# Patient Record
Sex: Female | Born: 1960 | Race: White | Hispanic: No | Marital: Married | Smoking: Never smoker
Health system: Southern US, Community
[De-identification: ages and names within clinical notes are randomized; demographics above are authoritative.]

## PROBLEM LIST (undated history)

## (undated) DIAGNOSIS — E039 Hypothyroidism, unspecified: Secondary | ICD-10-CM

## (undated) DIAGNOSIS — I10 Essential (primary) hypertension: Secondary | ICD-10-CM

## (undated) DIAGNOSIS — N3010 Interstitial cystitis (chronic) without hematuria: Secondary | ICD-10-CM

## (undated) DIAGNOSIS — E785 Hyperlipidemia, unspecified: Secondary | ICD-10-CM

## (undated) HISTORY — PX: ABLATION: SHX5711

## (undated) HISTORY — PX: CYSTOSCOPY: SUR368

## (undated) HISTORY — PX: PLANTAR FASCIA RELEASE: SHX2239

---

## 2004-04-20 ENCOUNTER — Ambulatory Visit: Payer: Self-pay | Admitting: Unknown Physician Specialty

## 2004-04-22 ENCOUNTER — Ambulatory Visit: Payer: Self-pay | Admitting: Unknown Physician Specialty

## 2005-06-09 ENCOUNTER — Ambulatory Visit: Payer: Self-pay | Admitting: Unknown Physician Specialty

## 2005-06-29 ENCOUNTER — Ambulatory Visit: Payer: Self-pay | Admitting: Unknown Physician Specialty

## 2007-05-16 ENCOUNTER — Ambulatory Visit: Payer: Self-pay | Admitting: Unknown Physician Specialty

## 2008-07-09 ENCOUNTER — Ambulatory Visit: Payer: Self-pay | Admitting: Unknown Physician Specialty

## 2008-07-14 ENCOUNTER — Ambulatory Visit: Payer: Self-pay | Admitting: Unknown Physician Specialty

## 2008-07-15 ENCOUNTER — Ambulatory Visit: Payer: Self-pay | Admitting: Unknown Physician Specialty

## 2009-04-15 ENCOUNTER — Emergency Department: Payer: Self-pay | Admitting: Emergency Medicine

## 2009-04-22 ENCOUNTER — Ambulatory Visit: Payer: Self-pay | Admitting: Internal Medicine

## 2009-07-15 ENCOUNTER — Ambulatory Visit: Payer: Self-pay | Admitting: Unknown Physician Specialty

## 2009-07-16 ENCOUNTER — Ambulatory Visit: Payer: Self-pay | Admitting: Unknown Physician Specialty

## 2009-11-10 ENCOUNTER — Ambulatory Visit: Payer: Self-pay | Admitting: Family Medicine

## 2010-04-14 ENCOUNTER — Other Ambulatory Visit: Payer: Self-pay | Admitting: Internal Medicine

## 2010-07-19 ENCOUNTER — Ambulatory Visit: Payer: Self-pay

## 2011-08-10 ENCOUNTER — Ambulatory Visit: Payer: Self-pay | Admitting: Internal Medicine

## 2012-08-15 ENCOUNTER — Ambulatory Visit: Payer: Self-pay | Admitting: Internal Medicine

## 2013-06-03 ENCOUNTER — Ambulatory Visit: Payer: Self-pay | Admitting: Obstetrics and Gynecology

## 2013-06-03 LAB — CBC
HCT: 40.9 % (ref 35.0–47.0)
HGB: 13.8 g/dL (ref 12.0–16.0)
MCH: 30.9 pg (ref 26.0–34.0)
MCHC: 33.8 g/dL (ref 32.0–36.0)
MCV: 91 fL (ref 80–100)
Platelet: 272 10*3/uL (ref 150–440)
RBC: 4.48 10*6/uL (ref 3.80–5.20)
RDW: 13.3 % (ref 11.5–14.5)
WBC: 7.9 10*3/uL (ref 3.6–11.0)

## 2013-06-09 ENCOUNTER — Ambulatory Visit: Payer: Self-pay | Admitting: Obstetrics and Gynecology

## 2013-06-11 LAB — PATHOLOGY REPORT

## 2013-08-21 ENCOUNTER — Ambulatory Visit: Payer: Self-pay | Admitting: Internal Medicine

## 2014-05-16 NOTE — Op Note (Signed)
PATIENT NAME:  Sara Marks, Sara Marks MR#:  [id number] DATE OF BIRTH:  1960/12/31  DATE OF PROCEDURE:  06/09/2013  PREOPERATIVE DIAGNOSIS:  Menorrhagia.   POSTOPERATIVE DIAGNOSES: 1.  Menorrhagia.  2.  Endometrial polyp.   OPERATIVE PROCEDURES: 1.  Hysteroscopy with dilation and curettage of the endometrium.  2.  NovaSure endometrial ablation.   SURGEON: Brayton Mars, M.D.   FIRST ASSISTANT: None.   ANESTHESIA: General.   INDICATIONS: The patient is a 54 year old  white female, para 1-0-0-1, with a long history of menorrhagia, who presents for definitive surgical management of abnormal uterine bleeding. Previous endometrial biopsy in October 2014 was benign. Pelvic ultrasound demonstrated normal endometrial stripe measuring 5.4 mm.   FINDINGS AT SURGERY: Revealed a uterus which was midplane to retroverted; uterine sounding was to 9 cm. Cervical length was 4 cm. The cavity width was 3.8 cm. There was a single 1 cm endometrial polyp identified within the endometrial cavity.   DESCRIPTION OF PROCEDURE: The patient was brought to the Operating Room where she was placed in the supine position. General anesthesia was induced without difficulty. She was placed in the dorsal lithotomy position using the bumblebee stirrups. A Betadine perineal and intravaginal prep and drape was performed in standard fashion. A latex-free catheter was used to drain 30 mL of urine from the bladder.   A weighted speculum was placed into the vagina, and a single-tooth tenaculum was placed on the anterior lip of the cervix. The uterus sounded to 9 cm. Hanks dilators were used up to a #8-French caliber in order to dilate the endocervical canal. The [facility] hysteroscope using lactated Ringer's as irrigant was used to assess the intrauterine cavity. Photo documentation was performed. Stone polyp forceps were used to remove the polyp. Curettage was then performed with both smooth and serrated curettes with production of a small  amount of tissue. This was sent to pathology. Repeat hysteroscopy demonstrated minimal residual tissue left behind. Next,  NovaSure ablation was performed in routine fashion. The NovaSure instrument was placed and deployed in standard fashion. After setting the instrument, the cavity width was noted to be 3.8 cm. Cavity test was performed which was successful. This was followed by the ablation procedure which took 59 seconds.   Following completion of the procedure, the instrument was removed. Repeat hysteroscopy was performed and demonstrated an excellent char effect on the uterine cavity. The procedure was then terminated with all instrumentation being removed from the vagina. The patient was then awakened, mobilized and taken to the recovery room in satisfactory condition.   ESTIMATED BLOOD LOSS: Minimal.   COMPLICATIONS: None.   All instruments, needle and sponge counts were verified as correct.   ____________________________ Alanda Slim. Ademide Schaberg, MD mad:cs D: 06/09/2013 14:18:36 ET T: 06/09/2013 14:51:49 ET JOB#: [id number]  cc: Hassell Done A. Delainee Tramel, MD, <Dictator> [facility] MD ELECTRONICALLY SIGNED 06/11/2013 8:11

## 2015-04-01 ENCOUNTER — Ambulatory Visit: Payer: Self-pay | Admitting: Physician Assistant

## 2015-04-01 ENCOUNTER — Encounter: Payer: Self-pay | Admitting: Physician Assistant

## 2015-04-01 VITALS — BP 140/90 | HR 76 | Temp 99.2°F

## 2015-04-01 DIAGNOSIS — R05 Cough: Secondary | ICD-10-CM

## 2015-04-01 DIAGNOSIS — R059 Cough, unspecified: Secondary | ICD-10-CM

## 2015-04-01 LAB — POCT INFLUENZA A/B
Influenza A, POC: NEGATIVE
Influenza B, POC: NEGATIVE

## 2015-04-01 MED ORDER — AZITHROMYCIN 250 MG PO TABS: ORAL_TABLET | ORAL | Status: DC

## 2015-04-01 MED ORDER — BENZONATATE 200 MG PO CAPS: 200.0000 mg | ORAL_CAPSULE | Freq: Three times a day (TID) | ORAL | Status: DC | PRN

## 2015-04-01 MED ORDER — OMEPRAZOLE 20 MG PO CPDR: 20.0000 mg | DELAYED_RELEASE_CAPSULE | Freq: Every day | ORAL | Status: DC

## 2015-04-01 NOTE — Progress Notes (Signed)
S: C/o runny nose and congestion for 3 days, + fever, chills, body aches this am, denies cp/sob, v/d; mucus was green this am but clear throughout the day, cough is sporadic, also ?if could have rx for prilosec, is paying otc prices  Using otc meds: robitussin  O: PE: vitals wnl, nad, perrl eomi, normocephalic, tms dull, nasal mucosa red and swollen, throat injected, neck supple no lymph, lungs c t a, cv rrr, neuro intact, flu swab  A:  Acute uri   P: tessalon, zpack, omeprazole 20mg  , drink fluids, continue regular meds , use otc meds of choice, return if not improving in 5 days, return earlier if worsening

## 2015-04-05 ENCOUNTER — Telehealth: Payer: Self-pay | Admitting: Physician Assistant

## 2015-04-05 MED ORDER — METHYLPREDNISOLONE 4 MG PO TBPK: ORAL_TABLET | ORAL | Status: DC

## 2015-04-05 NOTE — Progress Notes (Signed)
Pt called and is still coughing, ?if can have steroid Steroid sent to [facility]

## 2015-04-05 NOTE — Addendum Note (Signed)
Addended by: Versie Starks on: 04/05/2015 04:31 PM   Modules accepted: Orders

## 2015-05-12 DIAGNOSIS — E039 Hypothyroidism, unspecified: Secondary | ICD-10-CM | POA: Diagnosis not present

## 2015-05-12 DIAGNOSIS — Z0000 Encounter for general adult medical examination without abnormal findings: Secondary | ICD-10-CM | POA: Diagnosis not present

## 2015-05-13 DIAGNOSIS — Z0000 Encounter for general adult medical examination without abnormal findings: Secondary | ICD-10-CM | POA: Diagnosis not present

## 2015-05-13 DIAGNOSIS — R069 Unspecified abnormalities of breathing: Secondary | ICD-10-CM | POA: Diagnosis not present

## 2015-05-13 DIAGNOSIS — R0609 Other forms of dyspnea: Secondary | ICD-10-CM | POA: Diagnosis not present

## 2015-06-16 ENCOUNTER — Other Ambulatory Visit: Payer: Self-pay | Admitting: Obstetrics and Gynecology

## 2015-06-16 DIAGNOSIS — Z1231 Encounter for screening mammogram for malignant neoplasm of breast: Secondary | ICD-10-CM

## 2015-07-08 ENCOUNTER — Other Ambulatory Visit: Payer: Self-pay | Admitting: Obstetrics and Gynecology

## 2015-07-08 ENCOUNTER — Ambulatory Visit
Admission: RE | Admit: 2015-07-08 | Discharge: 2015-07-08 | Disposition: A | Discharge status: Home / Self Care | Payer: 59 | Source: Ambulatory Visit | Attending: Obstetrics and Gynecology | Admitting: Obstetrics and Gynecology

## 2015-07-08 DIAGNOSIS — Z1231 Encounter for screening mammogram for malignant neoplasm of breast: Secondary | ICD-10-CM

## 2015-07-15 ENCOUNTER — Other Ambulatory Visit: Payer: Self-pay | Admitting: Obstetrics and Gynecology

## 2015-07-15 DIAGNOSIS — N6459 Other signs and symptoms in breast: Secondary | ICD-10-CM

## 2015-07-16 DIAGNOSIS — H5213 Myopia, bilateral: Secondary | ICD-10-CM | POA: Diagnosis not present

## 2015-07-22 ENCOUNTER — Ambulatory Visit
Admission: RE | Admit: 2015-07-22 | Discharge: 2015-07-22 | Disposition: A | Discharge status: Home / Self Care | Payer: 59 | Source: Ambulatory Visit | Attending: Obstetrics and Gynecology | Admitting: Obstetrics and Gynecology

## 2015-07-22 DIAGNOSIS — R928 Other abnormal and inconclusive findings on diagnostic imaging of breast: Secondary | ICD-10-CM | POA: Insufficient documentation

## 2015-07-22 DIAGNOSIS — N63 Unspecified lump in breast: Secondary | ICD-10-CM | POA: Insufficient documentation

## 2015-07-22 DIAGNOSIS — N6489 Other specified disorders of breast: Secondary | ICD-10-CM | POA: Diagnosis not present

## 2015-07-22 DIAGNOSIS — R6889 Other general symptoms and signs: Secondary | ICD-10-CM | POA: Diagnosis not present

## 2015-07-22 DIAGNOSIS — N6459 Other signs and symptoms in breast: Secondary | ICD-10-CM

## 2015-07-28 ENCOUNTER — Other Ambulatory Visit: Payer: Self-pay | Admitting: Obstetrics and Gynecology

## 2015-07-28 DIAGNOSIS — N6320 Unspecified lump in the left breast, unspecified quadrant: Secondary | ICD-10-CM

## 2015-12-30 ENCOUNTER — Ambulatory Visit: Payer: Self-pay | Admitting: Physician Assistant

## 2015-12-30 VITALS — BP 122/90 | HR 80 | Temp 99.2°F

## 2015-12-30 DIAGNOSIS — J0100 Acute maxillary sinusitis, unspecified: Secondary | ICD-10-CM

## 2015-12-30 MED ORDER — PREDNISONE 10 MG PO TABS
30.0000 mg | ORAL_TABLET | Freq: Every day | ORAL | 0 refills | Status: DC
Start: 2015-12-30 — End: 2016-12-08

## 2015-12-30 MED ORDER — FLUCONAZOLE 150 MG PO TABS: 150.0000 mg | ORAL_TABLET | Freq: Once | ORAL | 0 refills | Status: AC

## 2015-12-30 MED ORDER — AMOXICILLIN-POT CLAVULANATE 875-125 MG PO TABS: 1.0000 | ORAL_TABLET | Freq: Two times a day (BID) | ORAL | 0 refills | Status: DC

## 2015-12-30 NOTE — Progress Notes (Signed)
S: c/o r sided facial swelling, states she thinks her sinus is infected, some teeth pressure but no pain, awoke with worse swelling this am, no fever, unable to get any mucus out  O: vitals wnl, nad, tms dull, nasal mucosa red/swollen, r side face at maxillary sinus swollen tender, swelling extends to bridge of nose, no redness of skin, throat wnl, neck supple no lymph, lungs c t a, cv rrr  A: acute sinusitis with possible abscess  P: augmentin 875 bid x 10d, pred 30mg  qd x 3d, diflucan if needed

## 2016-01-20 ENCOUNTER — Ambulatory Visit
Admission: RE | Admit: 2016-01-20 | Discharge: 2016-01-20 | Disposition: A | Discharge status: Home / Self Care | Payer: 59 | Source: Ambulatory Visit | Attending: Obstetrics and Gynecology | Admitting: Obstetrics and Gynecology

## 2016-01-20 DIAGNOSIS — N6320 Unspecified lump in the left breast, unspecified quadrant: Secondary | ICD-10-CM

## 2016-01-20 DIAGNOSIS — N6489 Other specified disorders of breast: Secondary | ICD-10-CM | POA: Insufficient documentation

## 2016-01-28 ENCOUNTER — Other Ambulatory Visit: Payer: Self-pay | Admitting: Obstetrics and Gynecology

## 2016-01-28 DIAGNOSIS — N6320 Unspecified lump in the left breast, unspecified quadrant: Secondary | ICD-10-CM

## 2016-05-08 DIAGNOSIS — Z0000 Encounter for general adult medical examination without abnormal findings: Secondary | ICD-10-CM | POA: Diagnosis not present

## 2016-05-15 DIAGNOSIS — Z0000 Encounter for general adult medical examination without abnormal findings: Secondary | ICD-10-CM | POA: Diagnosis not present

## 2016-05-15 DIAGNOSIS — R7989 Other specified abnormal findings of blood chemistry: Secondary | ICD-10-CM | POA: Diagnosis not present

## 2016-05-15 DIAGNOSIS — M7062 Trochanteric bursitis, left hip: Secondary | ICD-10-CM | POA: Diagnosis not present

## 2016-07-10 ENCOUNTER — Ambulatory Visit
Admission: RE | Admit: 2016-07-10 | Discharge: 2016-07-10 | Disposition: A | Discharge status: Home / Self Care | Payer: 59 | Source: Ambulatory Visit | Attending: Obstetrics and Gynecology | Admitting: Obstetrics and Gynecology

## 2016-07-10 DIAGNOSIS — N6320 Unspecified lump in the left breast, unspecified quadrant: Secondary | ICD-10-CM | POA: Diagnosis present

## 2016-07-10 DIAGNOSIS — N6001 Solitary cyst of right breast: Secondary | ICD-10-CM | POA: Diagnosis not present

## 2016-07-10 DIAGNOSIS — N6002 Solitary cyst of left breast: Secondary | ICD-10-CM | POA: Diagnosis not present

## 2016-07-10 DIAGNOSIS — R928 Other abnormal and inconclusive findings on diagnostic imaging of breast: Secondary | ICD-10-CM | POA: Diagnosis not present

## 2016-07-19 DIAGNOSIS — Z1211 Encounter for screening for malignant neoplasm of colon: Secondary | ICD-10-CM | POA: Diagnosis not present

## 2016-12-07 ENCOUNTER — Encounter: Payer: Self-pay | Admitting: *Deleted

## 2016-12-08 ENCOUNTER — Encounter
Admission: RE | Disposition: A | Discharge status: Home / Self Care | Payer: Self-pay | Source: Ambulatory Visit | Attending: Gastroenterology

## 2016-12-08 ENCOUNTER — Encounter: Payer: Self-pay | Admitting: *Deleted

## 2016-12-08 ENCOUNTER — Ambulatory Visit: Payer: 59 | Admitting: Anesthesiology

## 2016-12-08 ENCOUNTER — Ambulatory Visit
Admission: RE | Admit: 2016-12-08 | Discharge: 2016-12-08 | Disposition: A | Discharge status: Home / Self Care | Payer: 59 | Source: Ambulatory Visit | Attending: Gastroenterology | Admitting: Gastroenterology

## 2016-12-08 DIAGNOSIS — Z888 Allergy status to other drugs, medicaments and biological substances status: Secondary | ICD-10-CM | POA: Insufficient documentation

## 2016-12-08 DIAGNOSIS — Z1211 Encounter for screening for malignant neoplasm of colon: Secondary | ICD-10-CM | POA: Insufficient documentation

## 2016-12-08 DIAGNOSIS — Z91040 Latex allergy status: Secondary | ICD-10-CM | POA: Diagnosis not present

## 2016-12-08 DIAGNOSIS — E039 Hypothyroidism, unspecified: Secondary | ICD-10-CM | POA: Insufficient documentation

## 2016-12-08 DIAGNOSIS — D125 Benign neoplasm of sigmoid colon: Secondary | ICD-10-CM | POA: Insufficient documentation

## 2016-12-08 DIAGNOSIS — Z79899 Other long term (current) drug therapy: Secondary | ICD-10-CM | POA: Insufficient documentation

## 2016-12-08 DIAGNOSIS — D124 Benign neoplasm of descending colon: Secondary | ICD-10-CM | POA: Insufficient documentation

## 2016-12-08 DIAGNOSIS — D128 Benign neoplasm of rectum: Secondary | ICD-10-CM | POA: Diagnosis not present

## 2016-12-08 DIAGNOSIS — K621 Rectal polyp: Secondary | ICD-10-CM | POA: Diagnosis not present

## 2016-12-08 DIAGNOSIS — I10 Essential (primary) hypertension: Secondary | ICD-10-CM | POA: Insufficient documentation

## 2016-12-08 DIAGNOSIS — Z882 Allergy status to sulfonamides status: Secondary | ICD-10-CM | POA: Insufficient documentation

## 2016-12-08 DIAGNOSIS — K5730 Diverticulosis of large intestine without perforation or abscess without bleeding: Secondary | ICD-10-CM | POA: Diagnosis not present

## 2016-12-08 DIAGNOSIS — D120 Benign neoplasm of cecum: Secondary | ICD-10-CM | POA: Diagnosis not present

## 2016-12-08 DIAGNOSIS — K5790 Diverticulosis of intestine, part unspecified, without perforation or abscess without bleeding: Secondary | ICD-10-CM | POA: Diagnosis not present

## 2016-12-08 DIAGNOSIS — K635 Polyp of colon: Secondary | ICD-10-CM | POA: Diagnosis not present

## 2016-12-08 HISTORY — DX: Essential (primary) hypertension: I10

## 2016-12-08 HISTORY — DX: Hypothyroidism, unspecified: E03.9

## 2016-12-08 HISTORY — PX: COLONOSCOPY WITH PROPOFOL: SHX5780

## 2016-12-08 SURGERY — COLONOSCOPY WITH PROPOFOL
Anesthesia: General

## 2016-12-08 MED ORDER — LIDOCAINE HCL (PF) 2 % IJ SOLN
INTRAMUSCULAR | Status: AC
  Filled 2016-12-08: qty 10

## 2016-12-08 MED ORDER — LIDOCAINE HCL (CARDIAC) 20 MG/ML IV SOLN
INTRAVENOUS | Status: DC | PRN
  Administered 2016-12-08: 40 mg via INTRAVENOUS

## 2016-12-08 MED ORDER — SODIUM CHLORIDE 0.9 % IV SOLN
INTRAVENOUS | Status: DC
  Administered 2016-12-08: 09:00:00 via INTRAVENOUS

## 2016-12-08 MED ORDER — GLYCOPYRROLATE 0.2 MG/ML IJ SOLN
INTRAMUSCULAR | Status: AC
  Filled 2016-12-08: qty 1

## 2016-12-08 MED ORDER — GLYCOPYRROLATE 0.2 MG/ML IJ SOLN
INTRAMUSCULAR | Status: DC | PRN
  Administered 2016-12-08: 0.2 mg via INTRAVENOUS

## 2016-12-08 MED ORDER — SODIUM CHLORIDE 0.9 % IV SOLN: INTRAVENOUS | Status: DC

## 2016-12-08 MED ORDER — PROPOFOL 500 MG/50ML IV EMUL
INTRAVENOUS | Status: AC
  Filled 2016-12-08: qty 50

## 2016-12-08 MED ORDER — PROPOFOL 500 MG/50ML IV EMUL
INTRAVENOUS | Status: DC | PRN
  Administered 2016-12-08: 140 ug/kg/min via INTRAVENOUS

## 2016-12-08 MED ORDER — PROPOFOL 10 MG/ML IV BOLUS
INTRAVENOUS | Status: DC | PRN
  Administered 2016-12-08: 80 mg via INTRAVENOUS

## 2016-12-08 MED ORDER — PHENYLEPHRINE HCL 10 MG/ML IJ SOLN
INTRAMUSCULAR | Status: DC | PRN
  Administered 2016-12-08: 100 ug via INTRAVENOUS

## 2016-12-08 NOTE — Anesthesia Post-op Follow-up Note (Signed)
Anesthesia QCDR form completed.        

## 2016-12-08 NOTE — Transfer of Care (Signed)
Immediate Anesthesia Transfer of Care Note  Patient: Sara Marks  Procedure(s) Performed: COLONOSCOPY WITH PROPOFOL (N/A )  Patient Location: PACU  Anesthesia Type:MAC  Level of Consciousness: awake, drowsy and patient cooperative  Airway & Oxygen Therapy: Patient Spontanous Breathing and Patient connected to nasal cannula oxygen  Post-op Assessment: Report given to RN and Post -op Vital signs reviewed and stable  Post vital signs: Reviewed and stable  Last Vitals:  Vitals:   12/08/16 0808  BP: 121/63  Pulse: 68  Resp: 14  Temp: 37.1 C  SpO2: 100%    Last Pain:  Vitals:   12/08/16 0808  TempSrc: Tympanic         Complications: No apparent anesthesia complications

## 2016-12-08 NOTE — Anesthesia Postprocedure Evaluation (Signed)
Anesthesia Post Note  Patient: Sri N Cossin  Procedure(s) Performed: COLONOSCOPY WITH PROPOFOL (N/A )  Patient location during evaluation: Endoscopy Anesthesia Type: General Level of consciousness: awake and alert Pain management: pain level controlled Vital Signs Assessment: post-procedure vital signs reviewed and stable Respiratory status: spontaneous breathing, nonlabored ventilation, respiratory function stable and patient connected to nasal cannula oxygen Cardiovascular status: blood pressure returned to baseline and stable Postop Assessment: no apparent nausea or vomiting Anesthetic complications: no     Last Vitals:  Vitals:   12/08/16 0808 12/08/16 1050  BP: 121/63 (!) 90/42  Pulse: 68 60  Resp: 14 19  Temp: 37.1 C 36.6 C  SpO2: 100% 100%    Last Pain:  Vitals:   12/08/16 1050  TempSrc: Tympanic                 Precious Haws Kisa Fujii

## 2016-12-08 NOTE — H&P (Signed)
Outpatient short stay form Pre-procedure 12/08/2016 10:05 AM Lollie Sails MD  Primary Physician: Dr. Emily Filbert  Reason for visit:  Colonoscopy  History of present illness:  Patient is a 56 year old female presenting today for her initial colon cancer screening. Tolerated her prep well. She takes no aspirin or blood thinning agents.    Current Facility-Administered Medications:  .  0.9 %  sodium chloride infusion, , Intravenous, Continuous, Lollie Sails, MD, Last Rate: 20 mL/hr at 12/08/16 0830 .  0.9 %  sodium chloride infusion, , Intravenous, Continuous, Lollie Sails, MD  Medications Prior to Admission  Medication Sig Dispense Refill Last Dose  . hydrochlorothiazide (HYDRODIURIL) 25 MG tablet Take by mouth.   12/08/2016 at 0600  . levothyroxine (SYNTHROID, LEVOTHROID) 25 MCG tablet Take by mouth.   12/07/2016 at Unknown time  . lisinopril-hydrochlorothiazide (PRINZIDE,ZESTORETIC) 20-12.5 MG tablet   3 12/08/2016 at 0600  . metoprolol succinate (TOPROL-XL) 50 MG 24 hr tablet Take by mouth.   12/08/2016 at 0600  . EPINEPHrine (EPIPEN 2-PAK) 0.3 mg/0.3 mL IJ SOAJ injection INJECT 0.3 MLS INTO THE MUSCLE ONCE AS NEEDED FOR ANAPHYLAXIS.   Taking  . LORazepam (ATIVAN) 0.5 MG tablet Take by mouth.   Taking  . montelukast (SINGULAIR) 10 MG tablet   3 Taking     Allergies  Allergen Reactions  . Latex Anaphylaxis  . Other Anaphylaxis    BANANAS  . Sulfa Antibiotics Rash  . Floxin [Ofloxacin] Rash     Past Medical History:  Diagnosis Date  . Hypertension   . Hypothyroidism     Review of systems:      Physical Exam    Heart and lungs: Regular rate and rhythm without rub or gallop, lungs are bilaterally clear.    HEENT: Normocephalic atraumatic eyes are anicteric    Other:     Pertinant exam for procedure: Soft nontender nondistended bowel sounds positive normoactive    Planned proceedures: Colonoscopy and indicated procedures. I have discussed the  risks benefits and complications of procedures to include not limited to bleeding, infection, perforation and the risk of sedation and the patient wishes to proceed.     Lollie Sails, MD Gastroenterology 12/08/2016  10:05 AM

## 2016-12-08 NOTE — Op Note (Signed)
[facility] Gastroenterology Patient Name: Sara Marks Procedure Date: 12/08/2016 10:00 AM MRN: [id number] Account #: [id number] Date of Birth: 02-01-1960 Admit Type: Outpatient Age: 56 Room: [facility] ENDO ROOM 1 Gender: Female Note Status: Finalized Procedure:            Colonoscopy Indications:          Screening for colorectal malignant neoplasm, This is                        the patient's first colonoscopy Providers:            Lollie Sails, MD Referring MD:         Rusty Aus, MD (Referring MD) Medicines:            Monitored Anesthesia Care Complications:        No immediate complications. Procedure:            Pre-Anesthesia Assessment:                       - ASA Grade Assessment: III - A patient with severe                        systemic disease.                       After obtaining informed consent, the colonoscope was                        passed under direct vision. Throughout the procedure,                        the patient's blood pressure, pulse, and oxygen                        saturations were monitored continuously. The Olympus                        PCF-H180AL colonoscope ( S#: [id number] ) was introduced                        through the anus and advanced to the the cecum,                        identified by appendiceal orifice and ileocecal valve.                        The quality of the bowel preparation was good. The                        colonoscopy was performed without difficulty. The                        patient tolerated the procedure well. Findings:      A 7 mm polyp was found in the distal sigmoid colon. The polyp was       sessile. The polyp was removed with a cold snare. Resection and       retrieval were complete.      A 5 mm polyp was found in the descending colon. The polyp was sessile.       The polyp was removed with  a cold snare. Resection and retrieval were       complete.      A 1 mm polyp was found in  the ileocecal valve. The polyp was sessile.       The polyp was removed with a cold biopsy forceps. Resection and       retrieval were complete.      A 2 mm polyp was found in the proximal sigmoid colon. The polyp was       sessile. The polyp was removed with a cold biopsy forceps. Resection and       retrieval were complete.      A 3 mm polyp was found in the rectum. The polyp was sessile. The polyp       was removed with a cold biopsy forceps. Resection and retrieval were       complete.      No additional abnormalities were found on retroflexion.      The digital rectal exam was normal.      A few small-mouthed diverticula were found in the sigmoid colon and       distal descending colon. Impression:           - One 7 mm polyp in the distal sigmoid colon, removed                        with a cold snare. Resected and retrieved.                       - One 5 mm polyp in the descending colon, removed with                        a cold snare. Resected and retrieved.                       - One 1 mm polyp at the ileocecal valve, removed with a                        cold biopsy forceps. Resected and retrieved.                       - One 2 mm polyp in the proximal sigmoid colon, removed                        with a cold biopsy forceps. Resected and retrieved.                       - One 3 mm polyp in the rectum, removed with a cold                        biopsy forceps. Resected and retrieved. Recommendation:       - Discharge patient to home.                       - Telephone GI clinic for pathology results in 1 week. Procedure Code(s):    --- Professional ---                       [phone], Colonoscopy, flexible; with removal of tumor(s),  polyp(s), or other lesion(s) by snare technique                       45380, 59, Colonoscopy, flexible; with biopsy, single                        or multiple Diagnosis Code(s):    --- Professional ---                        Z12.11, Encounter for screening for malignant neoplasm                        of colon                       D12.4, Benign neoplasm of descending colon                       D12.0, Benign neoplasm of cecum                       D12.5, Benign neoplasm of sigmoid colon                       K62.1, Rectal polyp CPT copyright 2016 American Medical Association. All rights reserved. The codes documented in this report are preliminary and upon coder review may  be revised to meet current compliance requirements. Lollie Sails, MD 12/08/2016 10:51:14 AM This report has been signed electronically. Number of Addenda: 0 Note Initiated On: 12/08/2016 10:00 AM Scope Withdrawal Time: 0 hours 12 minutes 15 seconds  Total Procedure Duration: 0 hours 27 minutes 17 seconds       [facility]

## 2016-12-08 NOTE — Anesthesia Preprocedure Evaluation (Signed)
Anesthesia Evaluation  Patient identified by MRN, date of birth, ID band Patient awake    Reviewed: Allergy & Precautions, H&P , NPO status , Patient's Chart, lab work & pertinent test results  History of Anesthesia Complications Negative for: history of anesthetic complications  Airway Mallampati: III  TM Distance: <3 FB Neck ROM: full    Dental  (+) Chipped   Pulmonary neg pulmonary ROS, neg shortness of breath,           Cardiovascular Exercise Tolerance: Good hypertension, (-) angina(-) Past MI and (-) DOE      Neuro/Psych negative neurological ROS  negative psych ROS   GI/Hepatic negative GI ROS, Neg liver ROS, neg GERD  ,  Endo/Other  Hypothyroidism   Renal/GU negative Renal ROS  negative genitourinary   Musculoskeletal   Abdominal   Peds  Hematology negative hematology ROS (+)   Anesthesia Other Findings Past Medical History: No date: Hypertension No date: Hypothyroidism  Past Surgical History: No date: ABLATION No date: CYSTOSCOPY No date: PLANTAR FASCIA RELEASE; Bilateral  BMI    Body Mass Index:  35.25 kg/m      Reproductive/Obstetrics negative OB ROS                             Anesthesia Physical Anesthesia Plan  ASA: III  Anesthesia Plan: General   Post-op Pain Management:    Induction: Intravenous  PONV Risk Score and Plan: Propofol infusion  Airway Management Planned: Natural Airway and Nasal Cannula  Additional Equipment:   Intra-op Plan:   Post-operative Plan:   Informed Consent: I have reviewed the patients History and Physical, chart, labs and discussed the procedure including the risks, benefits and alternatives for the proposed anesthesia with the patient or authorized representative who has indicated his/her understanding and acceptance.   Dental Advisory Given  Plan Discussed with: Anesthesiologist, CRNA and Surgeon  Anesthesia Plan  Comments: (Patient consented for risks of anesthesia including but not limited to:  - adverse reactions to medications - risk of intubation if required - damage to teeth, lips or other oral mucosa - sore throat or hoarseness - Damage to heart, brain, lungs or loss of life  Patient voiced understanding.)        Anesthesia Quick Evaluation

## 2016-12-11 ENCOUNTER — Encounter: Payer: Self-pay | Admitting: Gastroenterology

## 2016-12-11 LAB — SURGICAL PATHOLOGY

## 2017-03-22 ENCOUNTER — Ambulatory Visit (INDEPENDENT_AMBULATORY_CARE_PROVIDER_SITE_OTHER): Payer: Self-pay | Admitting: Family Medicine

## 2017-03-22 ENCOUNTER — Encounter: Payer: Self-pay | Admitting: Family Medicine

## 2017-03-22 VITALS — Temp 98.1°F

## 2017-03-22 DIAGNOSIS — Z23 Encounter for immunization: Secondary | ICD-10-CM

## 2017-03-22 NOTE — Progress Notes (Signed)
Patient in office only for TDAP immunization only given IM left deltoid VIS and questionare completed Patient waited 15 mins No adverse reaction.

## 2017-05-09 DIAGNOSIS — R7989 Other specified abnormal findings of blood chemistry: Secondary | ICD-10-CM | POA: Diagnosis not present

## 2017-05-09 DIAGNOSIS — Z0000 Encounter for general adult medical examination without abnormal findings: Secondary | ICD-10-CM | POA: Diagnosis not present

## 2017-05-16 DIAGNOSIS — Z0000 Encounter for general adult medical examination without abnormal findings: Secondary | ICD-10-CM | POA: Diagnosis not present

## 2017-06-06 ENCOUNTER — Other Ambulatory Visit: Payer: Self-pay | Admitting: Internal Medicine

## 2017-06-06 DIAGNOSIS — Z1231 Encounter for screening mammogram for malignant neoplasm of breast: Secondary | ICD-10-CM

## 2017-07-11 ENCOUNTER — Ambulatory Visit
Admission: RE | Admit: 2017-07-11 | Discharge: 2017-07-11 | Disposition: A | Discharge status: Home / Self Care | Payer: 59 | Source: Ambulatory Visit | Attending: Internal Medicine | Admitting: Internal Medicine

## 2017-07-11 DIAGNOSIS — Z1231 Encounter for screening mammogram for malignant neoplasm of breast: Secondary | ICD-10-CM | POA: Diagnosis not present

## 2018-06-05 DIAGNOSIS — Z0000 Encounter for general adult medical examination without abnormal findings: Secondary | ICD-10-CM | POA: Diagnosis not present

## 2018-06-12 DIAGNOSIS — Z0000 Encounter for general adult medical examination without abnormal findings: Secondary | ICD-10-CM | POA: Diagnosis not present

## 2018-06-12 DIAGNOSIS — E782 Mixed hyperlipidemia: Secondary | ICD-10-CM | POA: Diagnosis not present

## 2018-11-07 IMAGING — MG MM DIGITAL SCREENING BILAT W/ TOMO W/ CAD
8 series · 8 of 24 positions shown · non-contrast
Comparison: Previous exam(s).

CLINICAL DATA: Screening.

EXAM:
DIGITAL SCREENING BILATERAL MAMMOGRAM WITH TOMO AND CAD

[L MLO synth-2D]
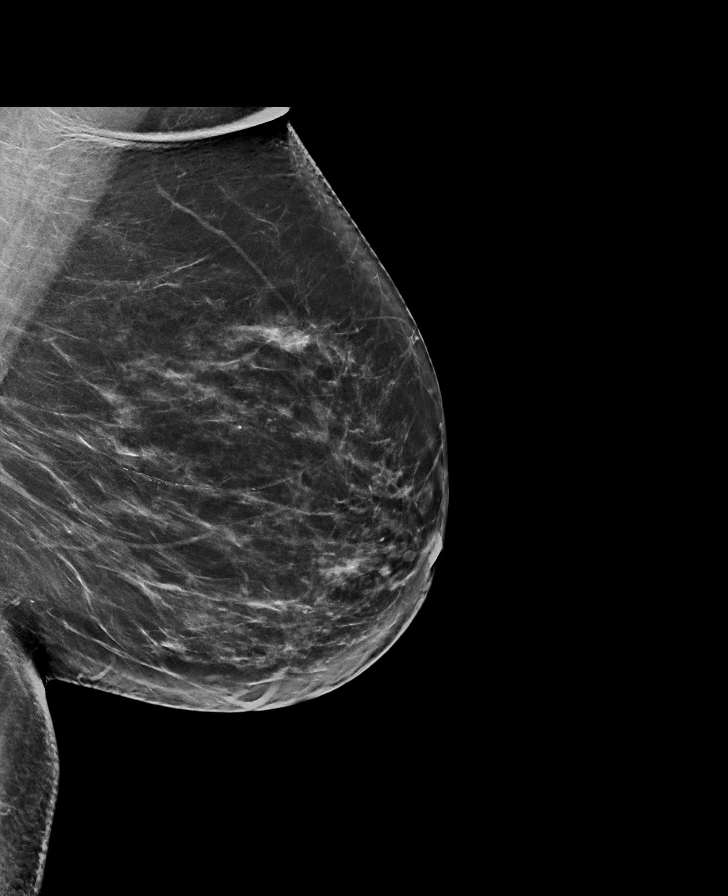

[R MLO synth-2D]
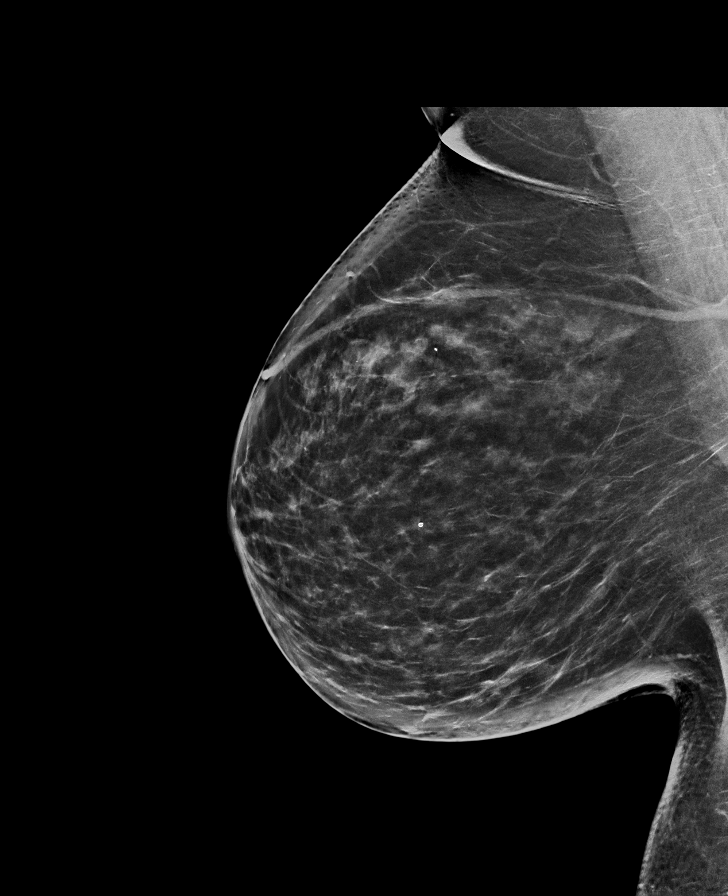

[L CC synth-2D]
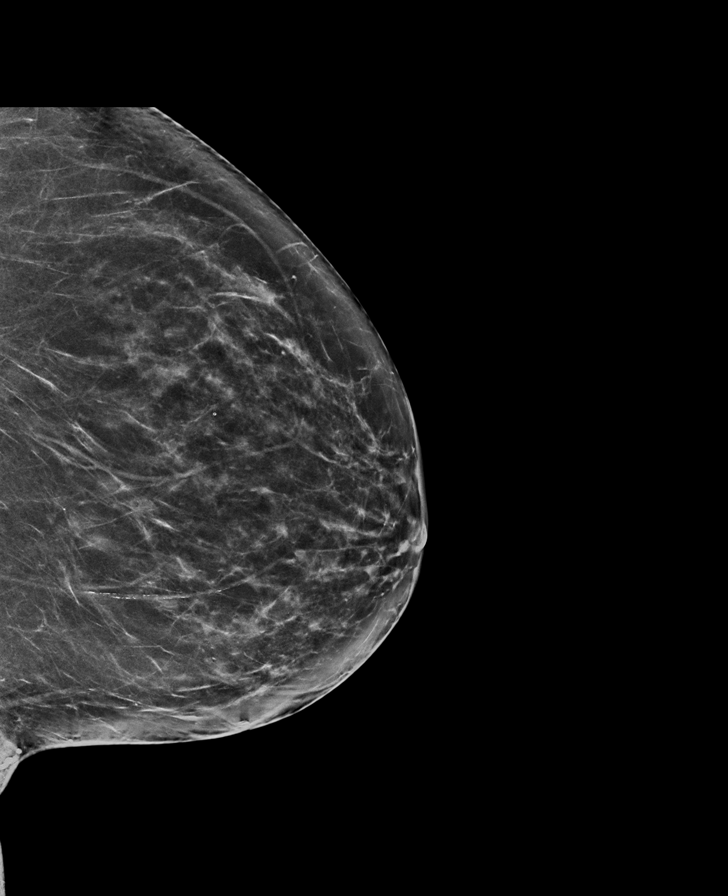

[R CC synth-2D]
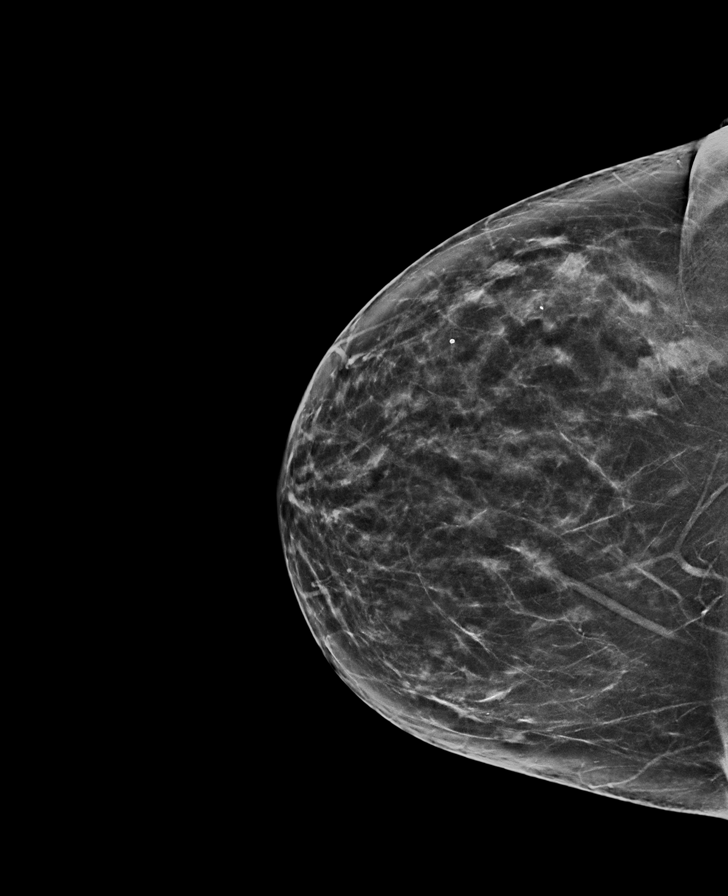

[L CC tomo · tomo slice 37/74.0]
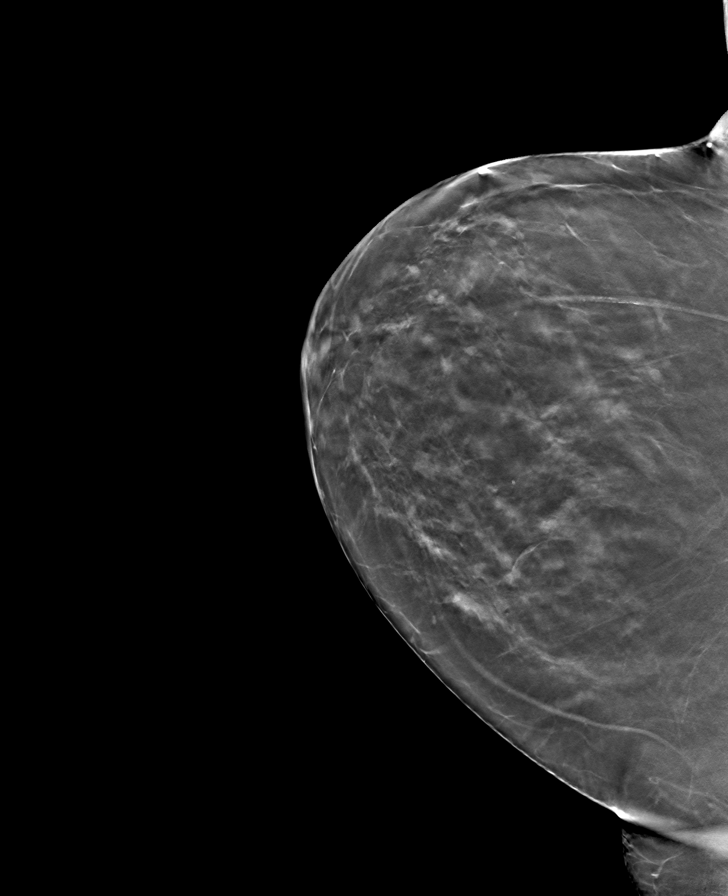

[R CC tomo · tomo slice 37/72.0]
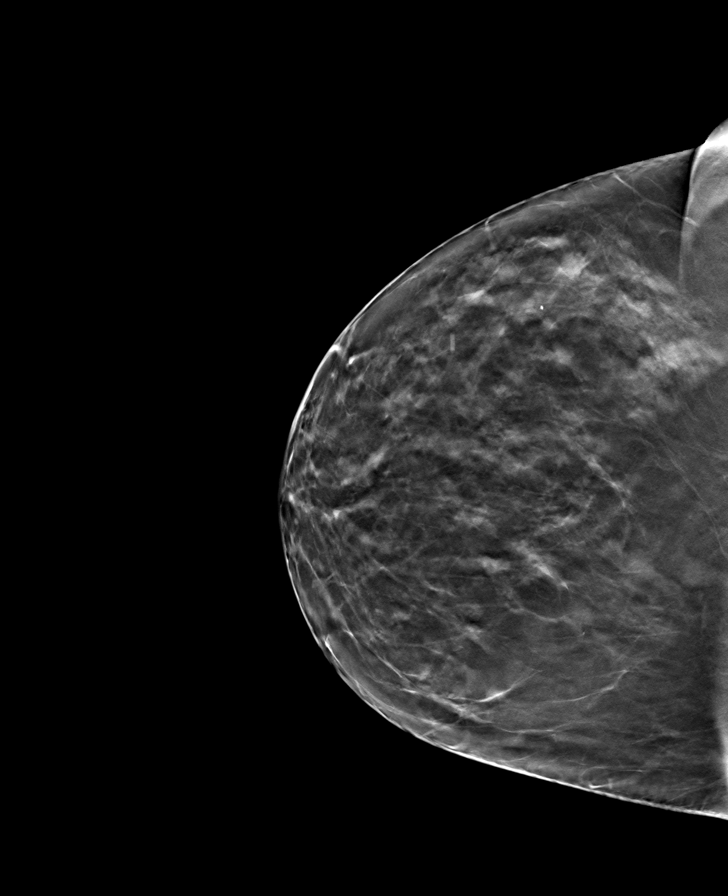

[L MLO tomo · tomo slice 41/80.0]
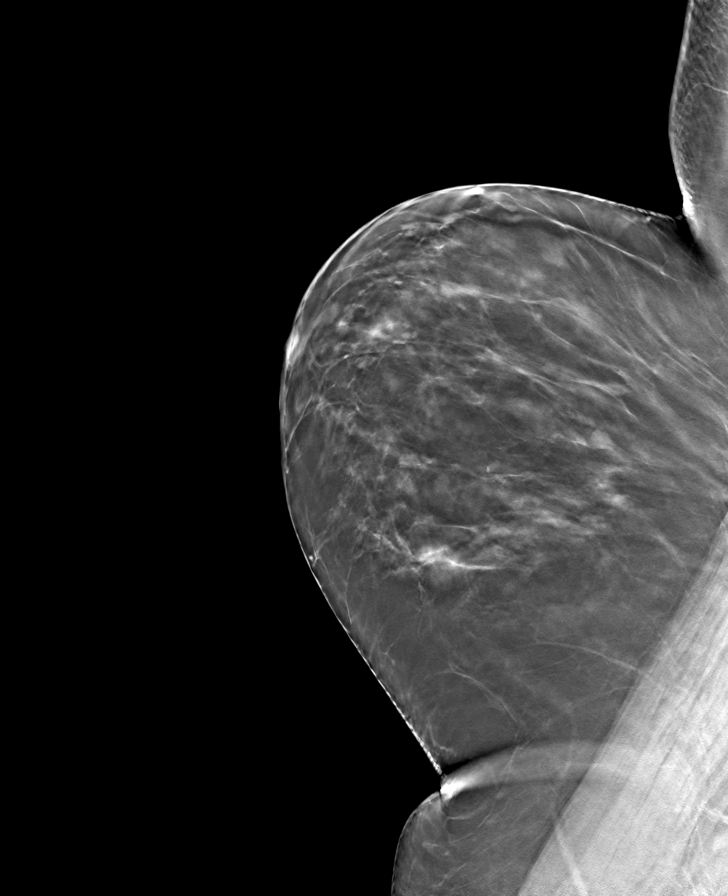

[R MLO tomo · tomo slice 41/82.0]
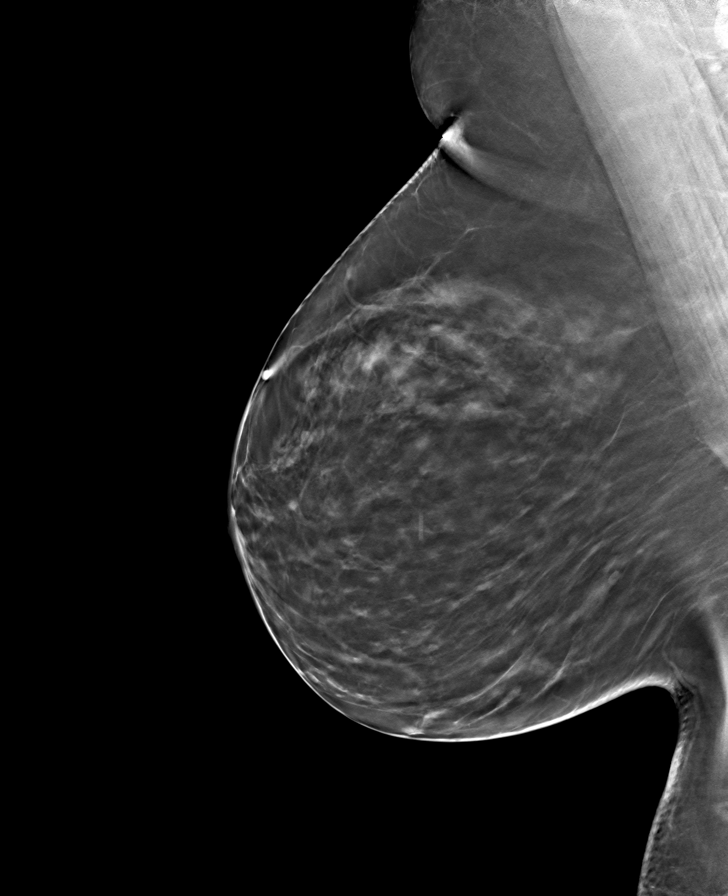

[8 of 24 positions shown; findings below may reference images not displayed]

ACR Breast Density Category c: The breast tissue is heterogeneously
dense, which may obscure small masses.
FINDINGS: There are no findings suspicious for malignancy. Images were
processed with CAD.
IMPRESSION: No mammographic evidence of malignancy. A result letter of this
screening mammogram will be mailed directly to the patient.

RECOMMENDATION:
Screening mammogram in one year. (Code:FT-U-LHB)

BI-RADS CATEGORY  1: Negative.

## 2019-06-20 DIAGNOSIS — Z13220 Encounter for screening for lipoid disorders: Secondary | ICD-10-CM | POA: Diagnosis not present

## 2019-06-20 DIAGNOSIS — Z0000 Encounter for general adult medical examination without abnormal findings: Secondary | ICD-10-CM | POA: Diagnosis not present

## 2019-06-20 DIAGNOSIS — Z131 Encounter for screening for diabetes mellitus: Secondary | ICD-10-CM | POA: Diagnosis not present

## 2019-06-20 DIAGNOSIS — Z1389 Encounter for screening for other disorder: Secondary | ICD-10-CM | POA: Diagnosis not present

## 2019-06-27 ENCOUNTER — Other Ambulatory Visit: Payer: Self-pay | Admitting: Internal Medicine

## 2019-06-27 DIAGNOSIS — R600 Localized edema: Secondary | ICD-10-CM | POA: Diagnosis not present

## 2019-06-27 DIAGNOSIS — M19072 Primary osteoarthritis, left ankle and foot: Secondary | ICD-10-CM | POA: Diagnosis not present

## 2019-06-27 DIAGNOSIS — M79672 Pain in left foot: Secondary | ICD-10-CM | POA: Diagnosis not present

## 2019-06-27 DIAGNOSIS — D369 Benign neoplasm, unspecified site: Secondary | ICD-10-CM | POA: Diagnosis not present

## 2019-06-27 DIAGNOSIS — G8929 Other chronic pain: Secondary | ICD-10-CM | POA: Diagnosis not present

## 2019-06-27 DIAGNOSIS — Z0000 Encounter for general adult medical examination without abnormal findings: Secondary | ICD-10-CM | POA: Diagnosis not present

## 2019-07-01 ENCOUNTER — Other Ambulatory Visit: Payer: Self-pay | Admitting: Internal Medicine

## 2019-07-09 ENCOUNTER — Other Ambulatory Visit: Payer: Self-pay | Admitting: Internal Medicine

## 2019-07-09 DIAGNOSIS — Z1231 Encounter for screening mammogram for malignant neoplasm of breast: Secondary | ICD-10-CM

## 2019-07-10 ENCOUNTER — Ambulatory Visit: Payer: 59

## 2019-07-11 ENCOUNTER — Ambulatory Visit: Payer: 59

## 2019-08-05 ENCOUNTER — Inpatient Hospital Stay: Admission: RE | Admit: 2019-08-05 | Payer: 59 | Source: Ambulatory Visit

## 2020-01-09 ENCOUNTER — Other Ambulatory Visit: Payer: Self-pay | Admitting: Family Medicine

## 2020-02-20 ENCOUNTER — Ambulatory Visit: Payer: 59 | Attending: Internal Medicine

## 2020-02-20 ENCOUNTER — Other Ambulatory Visit: Payer: Self-pay | Admitting: Internal Medicine

## 2020-02-20 DIAGNOSIS — Z23 Encounter for immunization: Secondary | ICD-10-CM

## 2020-02-20 NOTE — Progress Notes (Signed)
   Covid-19 Vaccination Clinic  Name:  Sara Marks    MRN: [id number] DOB: 1960-02-16  02/20/2020  [name] was observed post Covid-19 immunization for 30 minutes based on pre-vaccination screening without incident. She was provided with Vaccine Information Sheet and instruction to access the V-Safe system.   [name] was instructed to call 911 with any severe reactions post vaccine: Marland Kitchen Difficulty breathing  . Swelling of face and throat  . A fast heartbeat  . A bad rash all over body  . Dizziness and weakness   Immunizations Administered    Name Date Dose VIS Date Route   Pfizer COVID-19 Vaccine 02/20/2020 10:01 AM 0.3 mL 11/12/2019 Intramuscular   Manufacturer: [place]   Lot: [id number]   [place]: [id number]

## 2020-02-26 DIAGNOSIS — R457 State of emotional shock and stress, unspecified: Secondary | ICD-10-CM | POA: Diagnosis not present

## 2020-02-26 DIAGNOSIS — R1319 Other dysphagia: Secondary | ICD-10-CM | POA: Diagnosis not present

## 2020-02-26 DIAGNOSIS — Z86010 Personal history of colonic polyps: Secondary | ICD-10-CM | POA: Diagnosis not present

## 2020-02-26 DIAGNOSIS — K219 Gastro-esophageal reflux disease without esophagitis: Secondary | ICD-10-CM | POA: Diagnosis not present

## 2020-04-26 MED FILL — Metoprolol Succinate Tab ER 24HR 25 MG (Tartrate Equiv): ORAL | 90 days supply | Qty: 90 | Fill #0 | Status: AC

## 2020-04-26 MED FILL — Lisinopril & Hydrochlorothiazide Tab 20-12.5 MG: ORAL | 90 days supply | Qty: 90 | Fill #0 | Status: AC

## 2020-04-26 MED FILL — Hydrochlorothiazide Tab 25 MG: ORAL | 90 days supply | Qty: 90 | Fill #0 | Status: AC

## 2020-04-26 MED FILL — Levothyroxine Sodium Tab 25 MCG: ORAL | 90 days supply | Qty: 90 | Fill #0 | Status: AC

## 2020-04-27 ENCOUNTER — Other Ambulatory Visit: Payer: Self-pay

## 2020-04-27 ENCOUNTER — Other Ambulatory Visit
Admission: RE | Admit: 2020-04-27 | Discharge: 2020-04-27 | Disposition: A | Discharge status: Home / Self Care | Payer: 59 | Source: Ambulatory Visit | Attending: Gastroenterology | Admitting: Gastroenterology

## 2020-04-27 DIAGNOSIS — Z20822 Contact with and (suspected) exposure to covid-19: Secondary | ICD-10-CM | POA: Diagnosis not present

## 2020-04-27 DIAGNOSIS — Z01812 Encounter for preprocedural laboratory examination: Secondary | ICD-10-CM | POA: Diagnosis not present

## 2020-04-27 LAB — SARS CORONAVIRUS 2 (TAT 6-24 HRS): SARS Coronavirus 2: NEGATIVE

## 2020-04-30 ENCOUNTER — Ambulatory Visit: Payer: 59 | Admitting: Anesthesiology

## 2020-04-30 ENCOUNTER — Encounter
Admission: RE | Disposition: A | Discharge status: Home / Self Care | Payer: Self-pay | Source: Home / Self Care | Attending: Gastroenterology

## 2020-04-30 ENCOUNTER — Ambulatory Visit
Admission: RE | Admit: 2020-04-30 | Discharge: 2020-04-30 | Disposition: A | Discharge status: Home / Self Care | Payer: 59 | Attending: Gastroenterology | Admitting: Gastroenterology

## 2020-04-30 ENCOUNTER — Encounter: Payer: Self-pay | Admitting: *Deleted

## 2020-04-30 DIAGNOSIS — K2100 Gastro-esophageal reflux disease with esophagitis, without bleeding: Secondary | ICD-10-CM | POA: Insufficient documentation

## 2020-04-30 DIAGNOSIS — Z1211 Encounter for screening for malignant neoplasm of colon: Secondary | ICD-10-CM | POA: Diagnosis not present

## 2020-04-30 DIAGNOSIS — R1310 Dysphagia, unspecified: Secondary | ICD-10-CM | POA: Insufficient documentation

## 2020-04-30 DIAGNOSIS — K5281 Eosinophilic gastritis or gastroenteritis: Secondary | ICD-10-CM | POA: Diagnosis not present

## 2020-04-30 DIAGNOSIS — K219 Gastro-esophageal reflux disease without esophagitis: Secondary | ICD-10-CM | POA: Diagnosis not present

## 2020-04-30 DIAGNOSIS — Z7982 Long term (current) use of aspirin: Secondary | ICD-10-CM | POA: Diagnosis not present

## 2020-04-30 DIAGNOSIS — Z882 Allergy status to sulfonamides status: Secondary | ICD-10-CM | POA: Diagnosis not present

## 2020-04-30 DIAGNOSIS — Z79890 Hormone replacement therapy (postmenopausal): Secondary | ICD-10-CM | POA: Insufficient documentation

## 2020-04-30 DIAGNOSIS — K200 Eosinophilic esophagitis: Secondary | ICD-10-CM | POA: Diagnosis not present

## 2020-04-30 DIAGNOSIS — Z79899 Other long term (current) drug therapy: Secondary | ICD-10-CM | POA: Diagnosis not present

## 2020-04-30 DIAGNOSIS — Z91040 Latex allergy status: Secondary | ICD-10-CM | POA: Insufficient documentation

## 2020-04-30 DIAGNOSIS — K5289 Other specified noninfective gastroenteritis and colitis: Secondary | ICD-10-CM | POA: Diagnosis not present

## 2020-04-30 DIAGNOSIS — K5730 Diverticulosis of large intestine without perforation or abscess without bleeding: Secondary | ICD-10-CM | POA: Diagnosis not present

## 2020-04-30 DIAGNOSIS — K209 Esophagitis, unspecified without bleeding: Secondary | ICD-10-CM | POA: Diagnosis not present

## 2020-04-30 DIAGNOSIS — Z881 Allergy status to other antibiotic agents status: Secondary | ICD-10-CM | POA: Insufficient documentation

## 2020-04-30 DIAGNOSIS — K2950 Unspecified chronic gastritis without bleeding: Secondary | ICD-10-CM | POA: Diagnosis not present

## 2020-04-30 DIAGNOSIS — Z86010 Personal history of colonic polyps: Secondary | ICD-10-CM | POA: Diagnosis not present

## 2020-04-30 DIAGNOSIS — K635 Polyp of colon: Secondary | ICD-10-CM | POA: Diagnosis not present

## 2020-04-30 DIAGNOSIS — K2970 Gastritis, unspecified, without bleeding: Secondary | ICD-10-CM | POA: Diagnosis not present

## 2020-04-30 DIAGNOSIS — K649 Unspecified hemorrhoids: Secondary | ICD-10-CM | POA: Diagnosis not present

## 2020-04-30 DIAGNOSIS — K640 First degree hemorrhoids: Secondary | ICD-10-CM | POA: Diagnosis not present

## 2020-04-30 DIAGNOSIS — K5790 Diverticulosis of intestine, part unspecified, without perforation or abscess without bleeding: Secondary | ICD-10-CM | POA: Diagnosis not present

## 2020-04-30 HISTORY — DX: Interstitial cystitis (chronic) without hematuria: N30.10

## 2020-04-30 HISTORY — PX: ESOPHAGOGASTRODUODENOSCOPY (EGD) WITH PROPOFOL: SHX5813

## 2020-04-30 HISTORY — DX: Hyperlipidemia, unspecified: E78.5

## 2020-04-30 HISTORY — PX: COLONOSCOPY WITH PROPOFOL: SHX5780

## 2020-04-30 SURGERY — ESOPHAGOGASTRODUODENOSCOPY (EGD) WITH PROPOFOL
Anesthesia: General

## 2020-04-30 MED ORDER — SODIUM CHLORIDE 0.9 % IV SOLN
INTRAVENOUS | Status: DC
  Administered 2020-04-30: 1000 mL via INTRAVENOUS

## 2020-04-30 MED ORDER — PROPOFOL 500 MG/50ML IV EMUL
INTRAVENOUS | Status: DC | PRN
  Administered 2020-04-30: 75 ug/kg/min via INTRAVENOUS

## 2020-04-30 MED ORDER — MIDAZOLAM HCL 2 MG/2ML IJ SOLN
INTRAMUSCULAR | Status: DC | PRN
  Administered 2020-04-30: 2 mg via INTRAVENOUS

## 2020-04-30 MED ORDER — MIDAZOLAM HCL 2 MG/2ML IJ SOLN
INTRAMUSCULAR | Status: AC
  Filled 2020-04-30: qty 2

## 2020-04-30 MED ORDER — FENTANYL CITRATE (PF) 100 MCG/2ML IJ SOLN
INTRAMUSCULAR | Status: DC | PRN
  Administered 2020-04-30 (×2): 50 ug via INTRAVENOUS

## 2020-04-30 MED ORDER — FENTANYL CITRATE (PF) 100 MCG/2ML IJ SOLN
INTRAMUSCULAR | Status: AC
  Filled 2020-04-30: qty 2

## 2020-04-30 MED ORDER — EPHEDRINE 5 MG/ML INJ
INTRAVENOUS | Status: AC
  Filled 2020-04-30: qty 10

## 2020-04-30 MED ORDER — PROPOFOL 10 MG/ML IV BOLUS
INTRAVENOUS | Status: DC | PRN
  Administered 2020-04-30: 10 mg via INTRAVENOUS
  Administered 2020-04-30 (×2): 20 mg via INTRAVENOUS

## 2020-04-30 MED ORDER — EPHEDRINE SULFATE 50 MG/ML IJ SOLN
INTRAMUSCULAR | Status: DC | PRN
  Administered 2020-04-30: 10 mg via INTRAVENOUS

## 2020-04-30 MED ORDER — LIDOCAINE HCL (CARDIAC) PF 100 MG/5ML IV SOSY
PREFILLED_SYRINGE | INTRAVENOUS | Status: DC | PRN
  Administered 2020-04-30: 50 mg via INTRAVENOUS

## 2020-04-30 NOTE — Transfer of Care (Signed)
Immediate Anesthesia Transfer of Care Note  Patient: Sara Marks  Procedure(s) Performed: ESOPHAGOGASTRODUODENOSCOPY (EGD) WITH PROPOFOL (N/A ) COLONOSCOPY WITH PROPOFOL (N/A )  Patient Location: PACU  Anesthesia Type:General  Level of Consciousness: sedated  Airway & Oxygen Therapy: Patient Spontanous Breathing  Post-op Assessment: Report given to RN and Post -op Vital signs reviewed and stable  Post vital signs: Reviewed and stable  Last Vitals:  Vitals Value Taken Time  BP 135/103 04/30/20 1027  Temp    Pulse 69 04/30/20 1027  Resp 20 04/30/20 1027  SpO2 97 % 04/30/20 1027    Last Pain:  Vitals:   04/30/20 0840  TempSrc: Temporal  PainSc: 0-No pain         Complications: No complications documented.

## 2020-04-30 NOTE — H&P (Signed)
Outpatient short stay form Pre-procedure 04/30/2020 9:47 AM Raylene Miyamoto MD, MPH  Primary Physician: Dr. Sabra Heck  Reason for visit:  Dysphagia/Surveillance  History of present illness:   60 y/o lady with history of adenomatous polyps here for surveillance colonoscopy. Also with GERD that started after death of mother. Takes H2 blocker and PPI. Some mild dysphagia symptoms. No blood thinners. No abdominal surgeries, no family history of GI malignancies.    Current Facility-Administered Medications:  .  0.9 %  sodium chloride infusion, , Intravenous, Continuous, Asako Saliba, Hilton Cork, MD, Last Rate: 20 mL/hr at 04/30/20 0855, 1,000 mL at 04/30/20 0855  Medications Prior to Admission  Medication Sig Dispense Refill Last Dose  . aspirin EC 81 MG tablet Take 81 mg by mouth daily. Swallow whole.   Past Week at Unknown time  . DHA-EPA-Vitamin E (OMEGA-3 COMPLEX PO) Take by mouth.   Past Week at Unknown time  . EPINEPHrine 0.3 mg/0.3 mL IJ SOAJ injection INJECT 0.3 MLS INTO THE MUSCLE ONCE AS NEEDED FOR ANAPHYLAXIS.   Past Month at Unknown time  . etodolac (LODINE) 400 MG tablet Take 400 mg by mouth 2 (two) times daily.   Past Week at Unknown time  . famotidine (PEPCID) 10 MG tablet Take 10 mg by mouth 2 (two) times daily.   04/29/2020 at Unknown time  . fluticasone (FLONASE) 50 MCG/ACT nasal spray Place 2 sprays into both nostrils daily.   Past Week at Unknown time  . hydrochlorothiazide (HYDRODIURIL) 25 MG tablet Take by mouth.   04/29/2020 at Unknown time  . hydrochlorothiazide (HYDRODIURIL) 25 MG tablet TAKE 1 TABLET BY MOUTH ONCE DAILY 90 tablet 3 04/29/2020 at Unknown time  . levothyroxine (SYNTHROID, LEVOTHROID) 25 MCG tablet Take by mouth.   04/29/2020 at Unknown time  . lisinopril-hydrochlorothiazide (PRINZIDE,ZESTORETIC) 20-12.5 MG tablet   3 04/29/2020 at Unknown time  . lisinopril-hydrochlorothiazide (ZESTORETIC) 20-12.5 MG tablet TAKE 1 TABLET BY MOUTH ONCE DAILY 90 tablet 3 04/29/2020 at Unknown  time  . LORazepam (ATIVAN) 0.5 MG tablet Take by mouth.   Past Month at Unknown time  . metoprolol succinate (TOPROL-XL) 25 MG 24 hr tablet TAKE 1 TABLET (25 MG TOTAL) BY MOUTH ONCE DAILY 90 tablet 3 04/29/2020 at Unknown time  . metoprolol succinate (TOPROL-XL) 50 MG 24 hr tablet Take by mouth.   04/29/2020 at Unknown time  . montelukast (SINGULAIR) 10 MG tablet   3 Past Week at Unknown time  . Multiple Vitamin (MULTIVITAMIN) tablet Take 1 tablet by mouth daily.   Past Week at Unknown time  . azithromycin (ZITHROMAX) 250 MG tablet TAKE 2 TABLETS BY MOUTH ON DAY 1 THEN TAKE 1 TABLET DAILY FOR THE NEXT 4 DAYS (Patient taking differently: Take by mouth daily. for the next 4 days) 6 tablet 0   . COVID-19 mRNA vaccine, Pfizer, 30 MCG/0.3ML injection USE AS DIRECTED .3 mL 0   . levothyroxine (SYNTHROID) 25 MCG tablet TAKE 1 TABLET (25 MCG TOTAL) BY MOUTH ONCE DAILY TAKE ON AN EMPTY STOMACH WITH A GLASS OF WATER AT LEAST 30-60 MINUTES BEFORE BREAKFAST. 90 tablet 3      Allergies  Allergen Reactions  . Latex Anaphylaxis  . Other Anaphylaxis    BANANAS  . Sulfa Antibiotics Rash  . Floxin [Ofloxacin] Rash     Past Medical History:  Diagnosis Date  . Hyperlipidemia   . Hypertension   . Hypothyroidism   . Interstitial cystitis     Review of systems:  Otherwise negative.  Physical Exam  Gen: Alert, oriented. Appears stated age.  HEENT: PERRLA. Lungs: No respiratory distress CV: RRR Abd: soft, benign, no masses Ext: No edema    Planned procedures: Proceed with EGD/colonoscopy. The patient understands the nature of the planned procedure, indications, risks, alternatives and potential complications including but not limited to bleeding, infection, perforation, damage to internal organs and possible oversedation/side effects from anesthesia. The patient agrees and gives consent to proceed.  Please refer to procedure notes for findings, recommendations and patient disposition/instructions.      Raylene Miyamoto MD, MPH Gastroenterology 04/30/2020  9:47 AM

## 2020-04-30 NOTE — Interval H&P Note (Signed)
History and Physical Interval Note:  04/30/2020 9:51 AM  Sara Marks  has presented today for surgery, with the diagnosis of hx of aden, colon polyps,GERD, esophageal, dysphagia.  The various methods of treatment have been discussed with the patient and family. After consideration of risks, benefits and other options for treatment, the patient has consented to  Procedure(s): ESOPHAGOGASTRODUODENOSCOPY (EGD) WITH PROPOFOL (N/A) COLONOSCOPY WITH PROPOFOL (N/A) as a surgical intervention.  The patient's history has been reviewed, patient examined, no change in status, stable for surgery.  I have reviewed the patient's chart and labs.  Questions were answered to the patient's satisfaction.     [name]  Ok to proceed with EGD/Colonoscopy

## 2020-04-30 NOTE — Op Note (Signed)
[facility] Gastroenterology Patient Name: Sara Marks Procedure Date: 04/30/2020 9:48 AM MRN: [id number] Account #: [id number] Date of Birth: 1960/03/05 Admit Type: Outpatient Age: 60 Room: [facility] ENDO ROOM 3 Gender: Female Note Status: Finalized Procedure:             Upper GI endoscopy Indications:           Dysphagia, Gastro-esophageal reflux disease Providers:             Andrey Farmer MD, MD Referring MD:          Rusty Aus, MD (Referring MD) Medicines:             Monitored Anesthesia Care Complications:         No immediate complications. Estimated blood loss:                         Minimal. Procedure:             Pre-Anesthesia Assessment:                        - Prior to the procedure, a History and Physical was                         performed, and patient medications and allergies were                         reviewed. The patient is competent. The risks and                         benefits of the procedure and the sedation options and                         risks were discussed with the patient. All questions                         were answered and informed consent was obtained.                         Patient identification and proposed procedure were                         verified by the physician, the nurse, the anesthetist                         and the technician in the endoscopy suite. Mental                         Status Examination: alert and oriented. Airway                         Examination: normal oropharyngeal airway and neck                         mobility. Respiratory Examination: clear to                         auscultation. CV Examination: normal. Prophylactic  Antibiotics: The patient does not require prophylactic                         antibiotics. Prior Anticoagulants: The patient has                         taken no previous anticoagulant or antiplatelet                         agents.  ASA Grade Assessment: II - A patient with mild                         systemic disease. After reviewing the risks and                         benefits, the patient was deemed in satisfactory                         condition to undergo the procedure. The anesthesia                         plan was to use monitored anesthesia care (MAC).                         Immediately prior to administration of medications,                         the patient was re-assessed for adequacy to receive                         sedatives. The heart rate, respiratory rate, oxygen                         saturations, blood pressure, adequacy of pulmonary                         ventilation, and response to care were monitored                         throughout the procedure. The physical status of the                         patient was re-assessed after the procedure.                        After obtaining informed consent, the endoscope was                         passed under direct vision. Throughout the procedure,                         the patient's blood pressure, pulse, and oxygen                         saturations were monitored continuously. The Endoscope                         was introduced through the mouth, and advanced to the  second part of duodenum. The upper GI endoscopy was                         accomplished without difficulty. The patient tolerated                         the procedure well. Findings:      No endoscopic abnormality was evident in the esophagus to explain the       patient's complaint of dysphagia. Biopsies were obtained from the       proximal and distal esophagus with cold forceps for histology of       suspected eosinophilic esophagitis.      The examined esophagus was normal.      Localized moderate inflammation characterized by linear erosions was       found in the gastric antrum. Biopsies were taken with a cold forceps for        Helicobacter pylori testing. Estimated blood loss was minimal.      The exam of the stomach was otherwise normal.      The examined duodenum was normal. Impression:            - No endoscopic esophageal abnormality to explain                         patient's dysphagia. Biopsied.                        - Normal esophagus.                        - Gastritis. Biopsied.                        - Normal examined duodenum. Recommendation:        - Perform a colonoscopy today. Procedure Code(s):     --- Professional ---                        [phone], Esophagogastroduodenoscopy, flexible,                         transoral; with biopsy, single or multiple Diagnosis Code(s):     --- Professional ---                        R13.10, Dysphagia, unspecified                        K29.70, Gastritis, unspecified, without bleeding                        K21.9, Gastro-esophageal reflux disease without                         esophagitis CPT copyright 2019 American [employer]. All rights reserved. The codes documented in this report are preliminary and upon coder review may  be revised to meet current compliance requirements. Andrey Farmer MD, MD 04/30/2020 10:26:07 AM Number of Addenda: 0 Note Initiated On: 04/30/2020 9:48 AM Estimated Blood Loss:  Estimated blood loss was minimal.      [facility]

## 2020-04-30 NOTE — Anesthesia Preprocedure Evaluation (Signed)
Anesthesia Evaluation  Patient identified by MRN, date of birth, ID band Patient awake    Reviewed: Allergy & Precautions, NPO status , Patient's Chart, lab work & pertinent test results  History of Anesthesia Complications Negative for: history of anesthetic complications  Airway Mallampati: II  TM Distance: >3 FB Neck ROM: Full    Dental no notable dental hx.    Pulmonary neg pulmonary ROS, neg sleep apnea, neg COPD,    breath sounds clear to auscultation- rhonchi (-) wheezing      Cardiovascular Exercise Tolerance: Good hypertension, Pt. on medications (-) CAD, (-) Past MI, (-) Cardiac Stents and (-) CABG  Rhythm:Regular Rate:Normal - Systolic murmurs and - Diastolic murmurs    Neuro/Psych neg Seizures negative neurological ROS  negative psych ROS   GI/Hepatic negative GI ROS, Neg liver ROS,   Endo/Other  neg diabetesHypothyroidism   Renal/GU negative Renal ROS     Musculoskeletal negative musculoskeletal ROS (+)   Abdominal (+) + obese,   Peds  Hematology negative hematology ROS (+)   Anesthesia Other Findings Past Medical History: No date: Hyperlipidemia No date: Hypertension No date: Hypothyroidism No date: Interstitial cystitis   Reproductive/Obstetrics                             Anesthesia Physical Anesthesia Plan  ASA: II  Anesthesia Plan: General   Post-op Pain Management:    Induction: Intravenous  PONV Risk Score and Plan: 2 and Propofol infusion  Airway Management Planned: Natural Airway  Additional Equipment:   Intra-op Plan:   Post-operative Plan:   Informed Consent: I have reviewed the patients History and Physical, chart, labs and discussed the procedure including the risks, benefits and alternatives for the proposed anesthesia with the patient or authorized representative who has indicated his/her understanding and acceptance.     Dental advisory  given  Plan Discussed with: CRNA and Anesthesiologist  Anesthesia Plan Comments:         Anesthesia Quick Evaluation

## 2020-04-30 NOTE — Op Note (Signed)
[facility] Gastroenterology Patient Name: Sara Marks Procedure Date: 04/30/2020 9:47 AM MRN: [id number] Account #: [id number] Date of Birth: [birth year] Admit Type: Outpatient Age: 60 Room: [facility] ENDO ROOM 3 Gender: Female Note Status: Finalized Procedure:             Colonoscopy Indications:           High risk colon cancer surveillance: Personal history                         of multiple (3 or more) adenomas Providers:             Andrey Farmer MD, MD Referring MD:          Rusty Aus, MD (Referring MD) Medicines:             Monitored Anesthesia Care Complications:         No immediate complications. Procedure:             Pre-Anesthesia Assessment:                        - Prior to the procedure, a History and Physical was                         performed, and patient medications and allergies were                         reviewed. The patient is competent. The risks and                         benefits of the procedure and the sedation options and                         risks were discussed with the patient. All questions                         were answered and informed consent was obtained.                         Patient identification and proposed procedure were                         verified by the physician, the nurse, the anesthetist                         and the technician in the endoscopy suite. Mental                         Status Examination: alert and oriented. Airway                         Examination: normal oropharyngeal airway and neck                         mobility. Respiratory Examination: clear to                         auscultation. CV Examination: normal. Prophylactic  Antibiotics: The patient does not require prophylactic                         antibiotics. Prior Anticoagulants: The patient has                         taken no previous anticoagulant or antiplatelet                          agents. ASA Grade Assessment: II - A patient with mild                         systemic disease. After reviewing the risks and                         benefits, the patient was deemed in satisfactory                         condition to undergo the procedure. The anesthesia                         plan was to use monitored anesthesia care (MAC).                         Immediately prior to administration of medications,                         the patient was re-assessed for adequacy to receive                         sedatives. The heart rate, respiratory rate, oxygen                         saturations, blood pressure, adequacy of pulmonary                         ventilation, and response to care were monitored                         throughout the procedure. The physical status of the                         patient was re-assessed after the procedure.                        After obtaining informed consent, the colonoscope was                         passed under direct vision. Throughout the procedure,                         the patient's blood pressure, pulse, and oxygen                         saturations were monitored continuously. The                         Colonoscope was introduced through the anus and  advanced to the the cecum, identified by appendiceal                         orifice and ileocecal valve. The colonoscopy was                         performed without difficulty. The patient tolerated                         the procedure well. The quality of the bowel                         preparation was good. Findings:      The perianal and digital rectal examinations were normal.      A few small-mouthed diverticula were found in the sigmoid colon and       descending colon.      Internal hemorrhoids were found during retroflexion. The hemorrhoids       were Grade I (internal hemorrhoids that do not prolapse).      The exam was otherwise  without abnormality on direct and retroflexion       views. Impression:            - Diverticulosis in the sigmoid colon and in the                         descending colon.                        - Internal hemorrhoids.                        - The examination was otherwise normal on direct and                         retroflexion views.                        - No specimens collected. Recommendation:        - Discharge patient to home.                        - Resume previous diet.                        - Continue present medications.                        - Repeat colonoscopy in 5 years for surveillance.                        - Return to referring physician as previously                         scheduled. Procedure Code(s):     --- Professional ---                        P2330, Colorectal cancer screening; colonoscopy on                         individual at high risk Diagnosis Code(s):     ---  Professional ---                        Z86.010, Personal history of colonic polyps                        K64.0, First degree hemorrhoids                        K57.30, Diverticulosis of large intestine without                         perforation or abscess without bleeding CPT copyright 2019 American Medical Association. All rights reserved. The codes documented in this report are preliminary and upon coder review may  be revised to meet current compliance requirements. Andrey Farmer MD, MD 04/30/2020 10:28:17 AM Number of Addenda: 0 Note Initiated On: 04/30/2020 9:47 AM Scope Withdrawal Time: 0 hours 6 minutes 21 seconds  Total Procedure Duration: 0 hours 12 minutes 33 seconds  Estimated Blood Loss:  Estimated blood loss: none.      [facility]

## 2020-04-30 NOTE — Anesthesia Postprocedure Evaluation (Signed)
Anesthesia Post Note  Patient: Rayyan N Checo  Procedure(s) Performed: ESOPHAGOGASTRODUODENOSCOPY (EGD) WITH PROPOFOL (N/A ) COLONOSCOPY WITH PROPOFOL (N/A )  Patient location during evaluation: Endoscopy Anesthesia Type: General Level of consciousness: awake and alert and oriented Pain management: pain level controlled Vital Signs Assessment: post-procedure vital signs reviewed and stable Respiratory status: spontaneous breathing, nonlabored ventilation and respiratory function stable Cardiovascular status: blood pressure returned to baseline and stable Postop Assessment: no signs of nausea or vomiting Anesthetic complications: no   No complications documented.   Last Vitals:  Vitals:   04/30/20 1037 04/30/20 1047  BP:  114/69  Pulse: 70 62  Resp: 17 16  Temp:    SpO2: 98% 98%    Last Pain:  Vitals:   04/30/20 1047  TempSrc:   PainSc: 0-No pain                 Keaghan Staton

## 2020-05-03 ENCOUNTER — Encounter: Payer: Self-pay | Admitting: Gastroenterology

## 2020-05-04 LAB — SURGICAL PATHOLOGY

## 2020-05-07 ENCOUNTER — Other Ambulatory Visit: Payer: Self-pay

## 2020-05-07 MED ORDER — PANTOPRAZOLE SODIUM 40 MG PO TBEC
DELAYED_RELEASE_TABLET | ORAL | 3 refills | Status: AC
  Filled 2020-05-07: qty 60, 30d supply, fill #0
  Filled 2020-06-06: qty 60, 30d supply, fill #1

## 2020-05-14 ENCOUNTER — Other Ambulatory Visit: Payer: Self-pay

## 2020-05-14 MED ORDER — AMOXICILLIN-POT CLAVULANATE 500-125 MG PO TABS
ORAL_TABLET | ORAL | 0 refills | Status: AC
  Filled 2020-05-14: qty 21, 7d supply, fill #0

## 2020-05-14 MED ORDER — PREDNISONE 10 MG PO TABS
ORAL_TABLET | ORAL | 0 refills | Status: AC
  Filled 2020-05-14: qty 10, 10d supply, fill #0

## 2020-06-07 ENCOUNTER — Other Ambulatory Visit: Payer: Self-pay

## 2020-06-11 ENCOUNTER — Other Ambulatory Visit: Payer: Self-pay

## 2020-06-11 DIAGNOSIS — R897 Abnormal histological findings in specimens from other organs, systems and tissues: Secondary | ICD-10-CM | POA: Diagnosis not present

## 2020-06-11 MED ORDER — CHLORHEXIDINE GLUCONATE 0.12 % MT SOLN
OROMUCOSAL | 0 refills | Status: AC
  Filled 2020-06-11: qty 473, 15d supply, fill #0

## 2020-06-11 MED ORDER — HYDROCODONE-ACETAMINOPHEN 5-325 MG PO TABS
1.0000 | ORAL_TABLET | Freq: Four times a day (QID) | ORAL | 0 refills | Status: AC | PRN
  Filled 2020-06-11: qty 15, 4d supply, fill #0

## 2020-06-24 DIAGNOSIS — Z0000 Encounter for general adult medical examination without abnormal findings: Secondary | ICD-10-CM | POA: Diagnosis not present

## 2020-06-24 DIAGNOSIS — Z131 Encounter for screening for diabetes mellitus: Secondary | ICD-10-CM | POA: Diagnosis not present

## 2020-06-24 DIAGNOSIS — Z13220 Encounter for screening for lipoid disorders: Secondary | ICD-10-CM | POA: Diagnosis not present

## 2020-06-28 ENCOUNTER — Other Ambulatory Visit: Payer: Self-pay

## 2020-06-28 DIAGNOSIS — Z0000 Encounter for general adult medical examination without abnormal findings: Secondary | ICD-10-CM | POA: Diagnosis not present

## 2020-06-28 MED ORDER — METOPROLOL SUCCINATE ER 25 MG PO TB24
1.0000 | ORAL_TABLET | Freq: Every day | ORAL | 3 refills | Status: DC
  Filled 2020-06-28 – 2020-07-17 (×2): qty 90, 90d supply, fill #0
  Filled 2020-10-18: qty 90, 90d supply, fill #1
  Filled 2021-01-20: qty 90, 90d supply, fill #2
  Filled 2021-05-05: qty 90, 90d supply, fill #3

## 2020-06-28 MED ORDER — HYDROCHLOROTHIAZIDE 25 MG PO TABS
1.0000 | ORAL_TABLET | Freq: Every day | ORAL | 3 refills | Status: DC
  Filled 2020-06-28 – 2020-07-17 (×2): qty 90, 90d supply, fill #0
  Filled 2020-10-18: qty 90, 90d supply, fill #1
  Filled 2021-01-20: qty 90, 90d supply, fill #2
  Filled 2021-05-05: qty 90, 90d supply, fill #3

## 2020-06-28 MED ORDER — LORAZEPAM 0.5 MG PO TABS
ORAL_TABLET | ORAL | 1 refills | Status: DC
  Filled 2020-06-28 – 2020-07-13 (×2): qty 90, 90d supply, fill #0
  Filled 2020-11-29: qty 90, 90d supply, fill #1

## 2020-06-28 MED ORDER — LISINOPRIL-HYDROCHLOROTHIAZIDE 20-12.5 MG PO TABS
1.0000 | ORAL_TABLET | Freq: Every day | ORAL | 3 refills | Status: DC
  Filled 2020-06-28 – 2020-07-17 (×2): qty 90, 90d supply, fill #0
  Filled 2020-10-18: qty 90, 90d supply, fill #1
  Filled 2021-01-20: qty 90, 90d supply, fill #2
  Filled 2021-05-05: qty 90, 90d supply, fill #3

## 2020-06-28 MED ORDER — MONTELUKAST SODIUM 10 MG PO TABS
ORAL_TABLET | ORAL | 3 refills | Status: AC
  Filled 2020-06-28 – 2020-07-13 (×2): qty 90, 90d supply, fill #0

## 2020-06-28 MED ORDER — LEVOTHYROXINE SODIUM 25 MCG PO TABS
ORAL_TABLET | ORAL | 3 refills | Status: DC
  Filled 2020-06-28 – 2020-07-17 (×2): qty 90, 90d supply, fill #0
  Filled 2020-10-18: qty 90, 90d supply, fill #1
  Filled 2021-01-20: qty 90, 90d supply, fill #2
  Filled 2021-05-05: qty 90, 90d supply, fill #3

## 2020-07-12 DIAGNOSIS — K200 Eosinophilic esophagitis: Secondary | ICD-10-CM | POA: Diagnosis not present

## 2020-07-12 DIAGNOSIS — R457 State of emotional shock and stress, unspecified: Secondary | ICD-10-CM | POA: Diagnosis not present

## 2020-07-13 ENCOUNTER — Other Ambulatory Visit: Payer: Self-pay

## 2020-07-13 MED ORDER — PANTOPRAZOLE SODIUM 40 MG PO TBEC
DELAYED_RELEASE_TABLET | ORAL | 3 refills | Status: AC
  Filled 2020-07-13: qty 90, 90d supply, fill #0
  Filled 2020-10-18: qty 90, 90d supply, fill #1
  Filled 2021-01-20: qty 90, 90d supply, fill #2

## 2020-07-19 ENCOUNTER — Other Ambulatory Visit: Payer: Self-pay

## 2020-10-08 ENCOUNTER — Other Ambulatory Visit: Payer: Self-pay

## 2020-10-12 ENCOUNTER — Other Ambulatory Visit: Payer: Self-pay

## 2020-10-13 ENCOUNTER — Other Ambulatory Visit: Payer: Self-pay

## 2020-10-13 MED ORDER — NITROFURANTOIN MONOHYD MACRO 100 MG PO CAPS
100.0000 mg | ORAL_CAPSULE | Freq: Two times a day (BID) | ORAL | 1 refills | Status: DC
  Filled 2020-10-13: qty 14, 7d supply, fill #0
  Filled 2021-08-02: qty 14, 7d supply, fill #1

## 2020-10-18 ENCOUNTER — Other Ambulatory Visit: Payer: Self-pay

## 2020-10-29 ENCOUNTER — Other Ambulatory Visit: Payer: Self-pay

## 2020-11-29 ENCOUNTER — Other Ambulatory Visit: Payer: Self-pay

## 2020-12-14 ENCOUNTER — Other Ambulatory Visit: Payer: Self-pay

## 2021-01-11 ENCOUNTER — Other Ambulatory Visit: Payer: Self-pay

## 2021-01-11 DIAGNOSIS — K200 Eosinophilic esophagitis: Secondary | ICD-10-CM | POA: Diagnosis not present

## 2021-01-11 MED ORDER — PANTOPRAZOLE SODIUM 40 MG PO TBEC
DELAYED_RELEASE_TABLET | ORAL | 3 refills | Status: AC
  Filled 2021-01-11: qty 90, 90d supply, fill #0
  Filled 2021-05-05: qty 90, 90d supply, fill #1
  Filled 2021-08-02: qty 90, 90d supply, fill #2
  Filled 2021-10-24: qty 90, 90d supply, fill #3

## 2021-01-20 ENCOUNTER — Other Ambulatory Visit: Payer: Self-pay

## 2021-01-24 ENCOUNTER — Other Ambulatory Visit: Payer: Self-pay

## 2021-01-25 ENCOUNTER — Other Ambulatory Visit: Payer: Self-pay

## 2021-01-26 ENCOUNTER — Other Ambulatory Visit: Payer: Self-pay

## 2021-01-26 MED ORDER — LORAZEPAM 0.5 MG PO TABS
ORAL_TABLET | ORAL | 1 refills | Status: AC
  Filled 2021-01-26: qty 90, 90d supply, fill #0

## 2021-02-10 ENCOUNTER — Other Ambulatory Visit: Payer: Self-pay

## 2021-05-05 ENCOUNTER — Other Ambulatory Visit: Payer: Self-pay

## 2021-05-12 ENCOUNTER — Other Ambulatory Visit: Payer: Self-pay

## 2021-05-26 ENCOUNTER — Other Ambulatory Visit: Payer: Self-pay

## 2021-06-22 ENCOUNTER — Other Ambulatory Visit: Payer: Self-pay

## 2021-06-22 MED ORDER — ERYTHROMYCIN 5 MG/GM OP OINT: TOPICAL_OINTMENT | OPHTHALMIC | 0 refills | Status: AC

## 2021-06-22 MED ORDER — BACITRACIN-POLYMYXIN B 500-10000 UNIT/GM OP OINT: TOPICAL_OINTMENT | OPHTHALMIC | 0 refills | Status: AC

## 2021-06-29 DIAGNOSIS — Z131 Encounter for screening for diabetes mellitus: Secondary | ICD-10-CM | POA: Diagnosis not present

## 2021-06-29 DIAGNOSIS — Z13220 Encounter for screening for lipoid disorders: Secondary | ICD-10-CM | POA: Diagnosis not present

## 2021-06-29 DIAGNOSIS — Z0000 Encounter for general adult medical examination without abnormal findings: Secondary | ICD-10-CM | POA: Diagnosis not present

## 2021-07-01 ENCOUNTER — Other Ambulatory Visit: Payer: Self-pay

## 2021-07-01 DIAGNOSIS — Z0000 Encounter for general adult medical examination without abnormal findings: Secondary | ICD-10-CM | POA: Diagnosis not present

## 2021-07-01 MED ORDER — LEVOTHYROXINE SODIUM 25 MCG PO TABS
ORAL_TABLET | ORAL | 3 refills | Status: DC
  Filled 2021-07-01 – 2021-08-02 (×2): qty 90, 90d supply, fill #0
  Filled 2021-10-24: qty 90, 90d supply, fill #1
  Filled 2022-01-21: qty 90, 90d supply, fill #2
  Filled 2022-04-24: qty 90, 90d supply, fill #3

## 2021-07-01 MED ORDER — METOPROLOL SUCCINATE ER 25 MG PO TB24
25.0000 mg | ORAL_TABLET | Freq: Every day | ORAL | 3 refills | Status: DC
Start: 2021-07-01 — End: 2022-07-07
  Filled 2021-07-01 – 2021-08-02 (×2): qty 90, 90d supply, fill #0
  Filled 2021-10-24: qty 90, 90d supply, fill #1
  Filled 2022-01-21: qty 90, 90d supply, fill #2
  Filled 2022-04-24: qty 90, 90d supply, fill #3

## 2021-07-01 MED ORDER — LISINOPRIL-HYDROCHLOROTHIAZIDE 20-12.5 MG PO TABS
1.0000 | ORAL_TABLET | Freq: Every day | ORAL | 3 refills | Status: DC
  Filled 2021-07-01 – 2021-08-02 (×2): qty 90, 90d supply, fill #0
  Filled 2021-10-24: qty 90, 90d supply, fill #1
  Filled 2022-01-21: qty 90, 90d supply, fill #2
  Filled 2022-04-24: qty 90, 90d supply, fill #3

## 2021-07-01 MED ORDER — MONTELUKAST SODIUM 10 MG PO TABS
10.0000 mg | ORAL_TABLET | Freq: Every day | ORAL | 3 refills | Status: AC
  Filled 2021-07-01 – 2022-01-21 (×2): qty 90, 90d supply, fill #0

## 2021-07-01 MED ORDER — HYDROCHLOROTHIAZIDE 25 MG PO TABS
25.0000 mg | ORAL_TABLET | Freq: Every day | ORAL | 3 refills | Status: DC
  Filled 2021-07-01 – 2021-08-02 (×2): qty 90, 90d supply, fill #0
  Filled 2021-10-24: qty 90, 90d supply, fill #1
  Filled 2022-01-21: qty 90, 90d supply, fill #2
  Filled 2022-04-24: qty 90, 90d supply, fill #3

## 2021-07-01 MED ORDER — LORAZEPAM 0.5 MG PO TABS
ORAL_TABLET | ORAL | 1 refills | Status: DC
  Filled 2021-07-01 – 2021-08-02 (×2): qty 90, 90d supply, fill #0

## 2021-07-17 ENCOUNTER — Other Ambulatory Visit: Payer: Self-pay

## 2021-08-02 ENCOUNTER — Other Ambulatory Visit: Payer: Self-pay | Admitting: Internal Medicine

## 2021-08-02 ENCOUNTER — Other Ambulatory Visit: Payer: Self-pay

## 2021-08-02 DIAGNOSIS — Z1231 Encounter for screening mammogram for malignant neoplasm of breast: Secondary | ICD-10-CM

## 2021-08-04 ENCOUNTER — Ambulatory Visit
Admission: RE | Admit: 2021-08-04 | Discharge: 2021-08-04 | Disposition: A | Discharge status: Home / Self Care | Payer: 59 | Source: Ambulatory Visit | Attending: Internal Medicine | Admitting: Internal Medicine

## 2021-08-04 DIAGNOSIS — Z1231 Encounter for screening mammogram for malignant neoplasm of breast: Secondary | ICD-10-CM | POA: Insufficient documentation

## 2021-08-09 ENCOUNTER — Other Ambulatory Visit: Payer: Self-pay | Admitting: Internal Medicine

## 2021-08-09 DIAGNOSIS — R928 Other abnormal and inconclusive findings on diagnostic imaging of breast: Secondary | ICD-10-CM

## 2021-08-09 DIAGNOSIS — N6489 Other specified disorders of breast: Secondary | ICD-10-CM

## 2021-08-11 ENCOUNTER — Ambulatory Visit
Admission: RE | Admit: 2021-08-11 | Discharge: 2021-08-11 | Disposition: A | Discharge status: Home / Self Care | Payer: 59 | Source: Ambulatory Visit | Attending: Internal Medicine | Admitting: Internal Medicine

## 2021-08-11 DIAGNOSIS — N6489 Other specified disorders of breast: Secondary | ICD-10-CM

## 2021-08-11 DIAGNOSIS — R928 Other abnormal and inconclusive findings on diagnostic imaging of breast: Secondary | ICD-10-CM | POA: Diagnosis not present

## 2021-08-11 DIAGNOSIS — N6011 Diffuse cystic mastopathy of right breast: Secondary | ICD-10-CM | POA: Diagnosis not present

## 2021-08-11 DIAGNOSIS — R922 Inconclusive mammogram: Secondary | ICD-10-CM | POA: Diagnosis not present

## 2021-10-24 ENCOUNTER — Other Ambulatory Visit: Payer: Self-pay

## 2021-10-28 ENCOUNTER — Other Ambulatory Visit: Payer: Self-pay

## 2021-10-28 MED ORDER — NITROFURANTOIN MONOHYD MACRO 100 MG PO CAPS
ORAL_CAPSULE | ORAL | 1 refills | Status: AC
  Filled 2021-10-28: qty 14, 7d supply, fill #0
  Filled 2022-04-25 (×2): qty 14, 7d supply, fill #1

## 2022-01-22 ENCOUNTER — Other Ambulatory Visit: Payer: Self-pay

## 2022-04-24 ENCOUNTER — Other Ambulatory Visit: Payer: Self-pay

## 2022-04-25 ENCOUNTER — Other Ambulatory Visit: Payer: Self-pay

## 2022-04-26 ENCOUNTER — Other Ambulatory Visit: Payer: Self-pay

## 2022-04-26 MED ORDER — LORAZEPAM 0.5 MG PO TABS
0.5000 mg | ORAL_TABLET | Freq: Every day | ORAL | 1 refills | Status: AC | PRN
  Filled 2022-04-26: qty 90, 90d supply, fill #0
  Filled 2022-10-09: qty 90, 90d supply, fill #1

## 2022-07-03 DIAGNOSIS — Z0000 Encounter for general adult medical examination without abnormal findings: Secondary | ICD-10-CM | POA: Diagnosis not present

## 2022-07-07 ENCOUNTER — Other Ambulatory Visit: Payer: Self-pay

## 2022-07-07 DIAGNOSIS — Z0000 Encounter for general adult medical examination without abnormal findings: Secondary | ICD-10-CM | POA: Diagnosis not present

## 2022-07-07 MED ORDER — OMEPRAZOLE 20 MG PO CPDR
20.0000 mg | DELAYED_RELEASE_CAPSULE | Freq: Every day | ORAL | 3 refills | Status: AC
  Filled 2022-07-07: qty 90, 90d supply, fill #0
  Filled 2022-10-09: qty 90, 90d supply, fill #1
  Filled 2023-01-12: qty 90, 90d supply, fill #2
  Filled 2023-04-30: qty 90, 90d supply, fill #3

## 2022-07-07 MED ORDER — LISINOPRIL-HYDROCHLOROTHIAZIDE 20-12.5 MG PO TABS
1.0000 | ORAL_TABLET | Freq: Every day | ORAL | 3 refills | Status: DC
  Filled 2022-07-07: qty 90, 90d supply, fill #0
  Filled 2022-10-12: qty 90, 90d supply, fill #1
  Filled 2023-02-02: qty 90, 90d supply, fill #2
  Filled 2023-04-30: qty 90, 90d supply, fill #3

## 2022-07-07 MED ORDER — METOPROLOL SUCCINATE ER 25 MG PO TB24
25.0000 mg | ORAL_TABLET | Freq: Every day | ORAL | 3 refills | Status: DC
  Filled 2022-07-07: qty 90, 90d supply, fill #0
  Filled 2022-10-12: qty 90, 90d supply, fill #1
  Filled 2023-02-02: qty 90, 90d supply, fill #2
  Filled 2023-04-30: qty 90, 90d supply, fill #3

## 2022-07-07 MED ORDER — MONTELUKAST SODIUM 10 MG PO TABS
10.0000 mg | ORAL_TABLET | Freq: Every day | ORAL | 3 refills | Status: AC
  Filled 2022-07-07: qty 90, 90d supply, fill #0

## 2022-07-07 MED ORDER — LEVOTHYROXINE SODIUM 25 MCG PO TABS
25.0000 ug | ORAL_TABLET | Freq: Every day | ORAL | 3 refills | Status: DC
  Filled 2022-07-07: qty 90, 90d supply, fill #0
  Filled 2022-10-12: qty 90, 90d supply, fill #1
  Filled 2023-02-02: qty 90, 90d supply, fill #2
  Filled 2023-04-30: qty 90, 90d supply, fill #3

## 2022-07-07 MED ORDER — HYDROCHLOROTHIAZIDE 25 MG PO TABS
25.0000 mg | ORAL_TABLET | Freq: Every day | ORAL | 3 refills | Status: DC
  Filled 2022-07-07: qty 90, 90d supply, fill #0
  Filled 2022-10-12: qty 90, 90d supply, fill #1
  Filled 2023-02-02: qty 90, 90d supply, fill #2
  Filled 2023-04-30: qty 90, 90d supply, fill #3

## 2022-07-10 ENCOUNTER — Other Ambulatory Visit: Payer: Self-pay

## 2022-10-10 ENCOUNTER — Other Ambulatory Visit: Payer: Self-pay

## 2022-10-12 ENCOUNTER — Other Ambulatory Visit: Payer: Self-pay

## 2023-01-12 ENCOUNTER — Other Ambulatory Visit: Payer: Self-pay

## 2023-02-02 ENCOUNTER — Other Ambulatory Visit: Payer: Self-pay

## 2023-03-16 ENCOUNTER — Other Ambulatory Visit: Payer: Self-pay | Admitting: Internal Medicine

## 2023-03-16 DIAGNOSIS — Z1231 Encounter for screening mammogram for malignant neoplasm of breast: Secondary | ICD-10-CM

## 2023-03-29 ENCOUNTER — Ambulatory Visit
Admission: RE | Admit: 2023-03-29 | Discharge: 2023-03-29 | Disposition: A | Discharge status: Home / Self Care | Payer: Commercial Managed Care - PPO | Source: Ambulatory Visit | Attending: Internal Medicine | Admitting: Internal Medicine

## 2023-03-29 DIAGNOSIS — Z1231 Encounter for screening mammogram for malignant neoplasm of breast: Secondary | ICD-10-CM | POA: Insufficient documentation

## 2023-04-30 ENCOUNTER — Other Ambulatory Visit: Payer: Self-pay

## 2023-05-01 ENCOUNTER — Other Ambulatory Visit: Payer: Self-pay

## 2023-05-01 MED ORDER — LORAZEPAM 0.5 MG PO TABS
0.5000 mg | ORAL_TABLET | Freq: Every day | ORAL | 1 refills | Status: AC | PRN
  Filled 2023-05-01 – 2023-08-13 (×2): qty 90, 90d supply, fill #0

## 2023-05-02 ENCOUNTER — Other Ambulatory Visit: Payer: Self-pay

## 2023-05-14 ENCOUNTER — Other Ambulatory Visit: Payer: Self-pay

## 2023-06-28 DIAGNOSIS — Z0000 Encounter for general adult medical examination without abnormal findings: Secondary | ICD-10-CM | POA: Diagnosis not present

## 2023-07-12 ENCOUNTER — Other Ambulatory Visit: Payer: Self-pay

## 2023-07-12 DIAGNOSIS — E782 Mixed hyperlipidemia: Secondary | ICD-10-CM | POA: Diagnosis not present

## 2023-07-12 DIAGNOSIS — Z0000 Encounter for general adult medical examination without abnormal findings: Secondary | ICD-10-CM | POA: Diagnosis not present

## 2023-07-12 MED ORDER — MONTELUKAST SODIUM 10 MG PO TABS
10.0000 mg | ORAL_TABLET | Freq: Every day | ORAL | 3 refills | Status: DC
  Filled 2023-07-12: qty 90, 90d supply, fill #0

## 2023-07-12 MED ORDER — MELOXICAM 15 MG PO TABS
15.0000 mg | ORAL_TABLET | Freq: Every day | ORAL | 3 refills | Status: AC | PRN
  Filled 2023-07-12: qty 90, 90d supply, fill #0
  Filled 2023-10-12: qty 90, 90d supply, fill #1
  Filled 2024-01-28: qty 90, 90d supply, fill #2

## 2023-07-12 MED ORDER — LISINOPRIL-HYDROCHLOROTHIAZIDE 20-12.5 MG PO TABS
1.0000 | ORAL_TABLET | Freq: Every day | ORAL | 3 refills | Status: AC
  Filled 2023-08-12: qty 90, 90d supply, fill #0
  Filled 2023-11-09: qty 90, 90d supply, fill #1
  Filled 2024-01-28: qty 90, 90d supply, fill #2

## 2023-07-12 MED ORDER — HYDROCHLOROTHIAZIDE 25 MG PO TABS
25.0000 mg | ORAL_TABLET | Freq: Every day | ORAL | 3 refills | Status: AC
  Filled 2023-08-12: qty 90, 90d supply, fill #0
  Filled 2023-11-09: qty 90, 90d supply, fill #1
  Filled 2024-01-28: qty 90, 90d supply, fill #2

## 2023-07-12 MED ORDER — METOPROLOL SUCCINATE ER 25 MG PO TB24
25.0000 mg | ORAL_TABLET | Freq: Every day | ORAL | 3 refills | Status: AC
  Filled 2023-08-12: qty 90, 90d supply, fill #0
  Filled 2023-11-09: qty 90, 90d supply, fill #1
  Filled 2024-01-28: qty 90, 90d supply, fill #2

## 2023-07-12 MED ORDER — LEVOTHYROXINE SODIUM 25 MCG PO TABS
25.0000 ug | ORAL_TABLET | Freq: Every day | ORAL | 3 refills | Status: AC
  Filled 2023-08-12: qty 90, 90d supply, fill #0
  Filled 2023-11-09: qty 90, 90d supply, fill #1
  Filled 2024-01-28: qty 90, 90d supply, fill #2

## 2023-07-17 ENCOUNTER — Other Ambulatory Visit: Payer: Self-pay | Admitting: Internal Medicine

## 2023-07-17 DIAGNOSIS — E782 Mixed hyperlipidemia: Secondary | ICD-10-CM

## 2023-07-17 DIAGNOSIS — Z0000 Encounter for general adult medical examination without abnormal findings: Secondary | ICD-10-CM

## 2023-08-12 ENCOUNTER — Other Ambulatory Visit: Payer: Self-pay

## 2023-08-13 ENCOUNTER — Other Ambulatory Visit: Payer: Self-pay

## 2023-08-13 MED ORDER — OMEPRAZOLE 20 MG PO CPDR
20.0000 mg | DELAYED_RELEASE_CAPSULE | Freq: Every day | ORAL | 3 refills | Status: AC
  Filled 2023-08-13: qty 90, 90d supply, fill #0
  Filled 2023-11-09: qty 90, 90d supply, fill #1
  Filled 2024-01-28: qty 90, 90d supply, fill #2

## 2023-08-14 ENCOUNTER — Other Ambulatory Visit: Payer: Self-pay

## 2023-08-14 MED ORDER — LORAZEPAM 0.5 MG PO TABS
0.5000 mg | ORAL_TABLET | Freq: Every day | ORAL | 1 refills | Status: AC | PRN
  Filled 2023-08-14 – 2024-01-30 (×2): qty 90, 90d supply, fill #0

## 2023-10-24 ENCOUNTER — Other Ambulatory Visit: Payer: Self-pay

## 2023-10-25 ENCOUNTER — Other Ambulatory Visit: Payer: Self-pay

## 2023-10-25 MED ORDER — NITROFURANTOIN MONOHYD MACRO 100 MG PO CAPS
ORAL_CAPSULE | ORAL | 0 refills | Status: AC
  Filled 2023-10-25: qty 40, 20d supply, fill #0

## 2023-10-26 ENCOUNTER — Other Ambulatory Visit: Payer: Self-pay

## 2024-01-29 ENCOUNTER — Other Ambulatory Visit: Payer: Self-pay

## 2024-01-29 ENCOUNTER — Encounter: Payer: Self-pay | Admitting: Pharmacist

## 2024-01-30 ENCOUNTER — Other Ambulatory Visit: Payer: Self-pay

## 2024-01-30 ENCOUNTER — Other Ambulatory Visit (HOSPITAL_COMMUNITY): Payer: Self-pay

## 2024-02-07 ENCOUNTER — Other Ambulatory Visit (HOSPITAL_COMMUNITY): Payer: Self-pay
